# Patient Record
Sex: Male | Born: 1940 | Race: White | Hispanic: No | Marital: Married | Smoking: Former smoker
Health system: Southern US, Community
[De-identification: ages and names within clinical notes are randomized; demographics above are authoritative.]

## PROBLEM LIST (undated history)

## (undated) DIAGNOSIS — C139 Malignant neoplasm of hypopharynx, unspecified: Secondary | ICD-10-CM

## (undated) DIAGNOSIS — I4891 Unspecified atrial fibrillation: Secondary | ICD-10-CM

## (undated) DIAGNOSIS — G4730 Sleep apnea, unspecified: Secondary | ICD-10-CM

## (undated) DIAGNOSIS — R319 Hematuria, unspecified: Secondary | ICD-10-CM

## (undated) DIAGNOSIS — E039 Hypothyroidism, unspecified: Secondary | ICD-10-CM

## (undated) DIAGNOSIS — C140 Malignant neoplasm of pharynx, unspecified: Secondary | ICD-10-CM

## (undated) DIAGNOSIS — E7800 Pure hypercholesterolemia, unspecified: Secondary | ICD-10-CM

## (undated) DIAGNOSIS — B029 Zoster without complications: Secondary | ICD-10-CM

## (undated) HISTORY — PX: POLYPECTOMY: SHX149

## (undated) HISTORY — DX: Hypothyroidism, unspecified: E03.9

## (undated) HISTORY — DX: Hematuria, unspecified: R31.9

## (undated) HISTORY — DX: Malignant neoplasm of pharynx, unspecified: C14.0

## (undated) HISTORY — DX: Zoster without complications: B02.9

## (undated) HISTORY — DX: Malignant neoplasm of hypopharynx, unspecified: C13.9

## (undated) HISTORY — DX: Sleep apnea, unspecified: G47.30

## (undated) HISTORY — DX: Pure hypercholesterolemia, unspecified: E78.00

## (undated) HISTORY — PX: LAMINECTOMY: SHX219

## (undated) HISTORY — PX: INGUINAL HERNIA REPAIR: SUR1180

## (undated) HISTORY — DX: Unspecified atrial fibrillation: I48.91

---

## 1998-03-05 ENCOUNTER — Other Ambulatory Visit: Admission: RE | Admit: 1998-03-05 | Discharge: 1998-03-05 | Payer: Self-pay | Admitting: Urology

## 1999-04-25 ENCOUNTER — Emergency Department (HOSPITAL_COMMUNITY): Admission: EM | Admit: 1999-04-25 | Discharge: 1999-04-25 | Payer: Self-pay | Admitting: Emergency Medicine

## 1999-04-25 ENCOUNTER — Encounter: Payer: Self-pay | Admitting: Emergency Medicine

## 2001-09-17 ENCOUNTER — Ambulatory Visit (HOSPITAL_COMMUNITY): Admission: RE | Admit: 2001-09-17 | Discharge: 2001-09-17 | Payer: Self-pay | Admitting: Gastroenterology

## 2001-10-10 ENCOUNTER — Ambulatory Visit: Admission: RE | Admit: 2001-10-10 | Discharge: 2001-12-17 | Payer: Self-pay | Admitting: Radiation Oncology

## 2001-12-16 ENCOUNTER — Ambulatory Visit (HOSPITAL_COMMUNITY): Admission: RE | Admit: 2001-12-16 | Discharge: 2001-12-16 | Payer: Self-pay | Admitting: General Surgery

## 2002-01-23 ENCOUNTER — Ambulatory Visit: Admission: RE | Admit: 2002-01-23 | Discharge: 2002-01-23 | Payer: Self-pay | Admitting: Radiation Oncology

## 2002-07-03 ENCOUNTER — Ambulatory Visit: Admission: RE | Admit: 2002-07-03 | Discharge: 2002-07-03 | Payer: Self-pay | Admitting: Radiation Oncology

## 2002-08-07 ENCOUNTER — Ambulatory Visit: Admission: RE | Admit: 2002-08-07 | Discharge: 2002-08-07 | Payer: Self-pay | Admitting: Radiation Oncology

## 2002-11-06 ENCOUNTER — Ambulatory Visit (HOSPITAL_COMMUNITY): Admission: RE | Admit: 2002-11-06 | Discharge: 2002-11-06 | Payer: Self-pay | Admitting: Radiation Oncology

## 2002-11-06 ENCOUNTER — Ambulatory Visit: Admission: RE | Admit: 2002-11-06 | Discharge: 2002-11-06 | Payer: Self-pay | Admitting: Radiation Oncology

## 2003-03-12 ENCOUNTER — Ambulatory Visit: Admission: RE | Admit: 2003-03-12 | Discharge: 2003-03-12 | Payer: Self-pay | Admitting: Radiation Oncology

## 2003-03-25 ENCOUNTER — Ambulatory Visit: Admission: RE | Admit: 2003-03-25 | Discharge: 2003-03-25 | Payer: Self-pay | Admitting: Radiation Oncology

## 2003-07-30 ENCOUNTER — Ambulatory Visit: Admission: RE | Admit: 2003-07-30 | Discharge: 2003-07-30 | Payer: Self-pay | Admitting: Radiation Oncology

## 2003-09-26 ENCOUNTER — Emergency Department (HOSPITAL_COMMUNITY): Admission: EM | Admit: 2003-09-26 | Discharge: 2003-09-26 | Payer: Self-pay | Admitting: Emergency Medicine

## 2003-12-02 ENCOUNTER — Ambulatory Visit: Admission: RE | Admit: 2003-12-02 | Discharge: 2003-12-10 | Payer: Self-pay | Admitting: Radiation Oncology

## 2004-01-07 ENCOUNTER — Ambulatory Visit (HOSPITAL_COMMUNITY): Admission: RE | Admit: 2004-01-07 | Discharge: 2004-01-07 | Payer: Self-pay | Admitting: Internal Medicine

## 2004-06-08 ENCOUNTER — Ambulatory Visit: Admission: RE | Admit: 2004-06-08 | Discharge: 2004-06-08 | Payer: Self-pay | Admitting: Radiation Oncology

## 2004-06-10 ENCOUNTER — Ambulatory Visit (HOSPITAL_COMMUNITY): Admission: RE | Admit: 2004-06-10 | Discharge: 2004-06-10 | Payer: Self-pay | Admitting: Radiation Oncology

## 2004-06-13 ENCOUNTER — Ambulatory Visit (HOSPITAL_BASED_OUTPATIENT_CLINIC_OR_DEPARTMENT_OTHER): Admission: RE | Admit: 2004-06-13 | Discharge: 2004-06-13 | Payer: Self-pay | Admitting: General Surgery

## 2004-06-13 ENCOUNTER — Ambulatory Visit (HOSPITAL_COMMUNITY): Admission: RE | Admit: 2004-06-13 | Discharge: 2004-06-13 | Payer: Self-pay | Admitting: General Surgery

## 2004-12-07 ENCOUNTER — Ambulatory Visit: Admission: RE | Admit: 2004-12-07 | Discharge: 2004-12-19 | Payer: Self-pay | Admitting: Radiation Oncology

## 2005-04-05 ENCOUNTER — Ambulatory Visit (HOSPITAL_BASED_OUTPATIENT_CLINIC_OR_DEPARTMENT_OTHER): Admission: RE | Admit: 2005-04-05 | Discharge: 2005-04-05 | Payer: Self-pay | Admitting: Urology

## 2005-05-31 ENCOUNTER — Ambulatory Visit: Admission: RE | Admit: 2005-05-31 | Discharge: 2005-07-06 | Payer: Self-pay | Admitting: Radiation Oncology

## 2005-05-31 LAB — BUN: BUN: 16 mg/dL (ref 6–23)

## 2005-06-02 ENCOUNTER — Ambulatory Visit (HOSPITAL_COMMUNITY): Admission: RE | Admit: 2005-06-02 | Discharge: 2005-06-02 | Payer: Self-pay | Admitting: Radiation Oncology

## 2005-06-07 LAB — THYROID PROFILE - CHCC: Free Thyroxine Index: 2.3 (ref 1.0–3.9)

## 2006-01-10 ENCOUNTER — Ambulatory Visit: Admission: RE | Admit: 2006-01-10 | Discharge: 2006-03-26 | Payer: Self-pay | Admitting: Radiation Oncology

## 2010-09-21 ENCOUNTER — Other Ambulatory Visit: Payer: Self-pay | Admitting: Dermatology

## 2011-01-19 DIAGNOSIS — L723 Sebaceous cyst: Secondary | ICD-10-CM | POA: Diagnosis not present

## 2011-01-19 DIAGNOSIS — M25519 Pain in unspecified shoulder: Secondary | ICD-10-CM | POA: Diagnosis not present

## 2011-01-19 DIAGNOSIS — Z85828 Personal history of other malignant neoplasm of skin: Secondary | ICD-10-CM | POA: Diagnosis not present

## 2011-01-19 DIAGNOSIS — E7800 Pure hypercholesterolemia, unspecified: Secondary | ICD-10-CM | POA: Diagnosis not present

## 2011-01-19 DIAGNOSIS — E039 Hypothyroidism, unspecified: Secondary | ICD-10-CM | POA: Diagnosis not present

## 2011-01-19 DIAGNOSIS — Z79899 Other long term (current) drug therapy: Secondary | ICD-10-CM | POA: Diagnosis not present

## 2011-01-25 DIAGNOSIS — M719 Bursopathy, unspecified: Secondary | ICD-10-CM | POA: Diagnosis not present

## 2011-01-25 DIAGNOSIS — M67919 Unspecified disorder of synovium and tendon, unspecified shoulder: Secondary | ICD-10-CM | POA: Diagnosis not present

## 2011-01-25 DIAGNOSIS — M25519 Pain in unspecified shoulder: Secondary | ICD-10-CM | POA: Diagnosis not present

## 2011-03-08 DIAGNOSIS — M25519 Pain in unspecified shoulder: Secondary | ICD-10-CM | POA: Diagnosis not present

## 2011-09-18 DIAGNOSIS — M2040 Other hammer toe(s) (acquired), unspecified foot: Secondary | ICD-10-CM | POA: Diagnosis not present

## 2011-10-17 DIAGNOSIS — D3130 Benign neoplasm of unspecified choroid: Secondary | ICD-10-CM | POA: Diagnosis not present

## 2011-10-17 DIAGNOSIS — H5200 Hypermetropia, unspecified eye: Secondary | ICD-10-CM | POA: Diagnosis not present

## 2012-01-19 DIAGNOSIS — Z125 Encounter for screening for malignant neoplasm of prostate: Secondary | ICD-10-CM | POA: Diagnosis not present

## 2012-01-19 DIAGNOSIS — E7800 Pure hypercholesterolemia, unspecified: Secondary | ICD-10-CM | POA: Diagnosis not present

## 2012-01-19 DIAGNOSIS — E039 Hypothyroidism, unspecified: Secondary | ICD-10-CM | POA: Diagnosis not present

## 2012-01-19 DIAGNOSIS — M6530 Trigger finger, unspecified finger: Secondary | ICD-10-CM | POA: Diagnosis not present

## 2012-01-19 DIAGNOSIS — R7301 Impaired fasting glucose: Secondary | ICD-10-CM | POA: Diagnosis not present

## 2012-01-19 DIAGNOSIS — Z1331 Encounter for screening for depression: Secondary | ICD-10-CM | POA: Diagnosis not present

## 2012-02-07 DIAGNOSIS — M6530 Trigger finger, unspecified finger: Secondary | ICD-10-CM | POA: Diagnosis not present

## 2012-10-17 DIAGNOSIS — D3130 Benign neoplasm of unspecified choroid: Secondary | ICD-10-CM | POA: Diagnosis not present

## 2012-10-17 DIAGNOSIS — H2589 Other age-related cataract: Secondary | ICD-10-CM | POA: Diagnosis not present

## 2012-10-17 DIAGNOSIS — H524 Presbyopia: Secondary | ICD-10-CM | POA: Diagnosis not present

## 2013-02-03 DIAGNOSIS — L723 Sebaceous cyst: Secondary | ICD-10-CM | POA: Diagnosis not present

## 2013-02-03 DIAGNOSIS — L570 Actinic keratosis: Secondary | ICD-10-CM | POA: Diagnosis not present

## 2013-02-04 DIAGNOSIS — R7301 Impaired fasting glucose: Secondary | ICD-10-CM | POA: Diagnosis not present

## 2013-02-04 DIAGNOSIS — R9431 Abnormal electrocardiogram [ECG] [EKG]: Secondary | ICD-10-CM | POA: Diagnosis not present

## 2013-02-04 DIAGNOSIS — Z125 Encounter for screening for malignant neoplasm of prostate: Secondary | ICD-10-CM | POA: Diagnosis not present

## 2013-02-04 DIAGNOSIS — Z23 Encounter for immunization: Secondary | ICD-10-CM | POA: Diagnosis not present

## 2013-02-04 DIAGNOSIS — Z1331 Encounter for screening for depression: Secondary | ICD-10-CM | POA: Diagnosis not present

## 2013-02-04 DIAGNOSIS — I4891 Unspecified atrial fibrillation: Secondary | ICD-10-CM | POA: Diagnosis not present

## 2013-02-04 DIAGNOSIS — E7800 Pure hypercholesterolemia, unspecified: Secondary | ICD-10-CM | POA: Diagnosis not present

## 2013-02-04 DIAGNOSIS — E039 Hypothyroidism, unspecified: Secondary | ICD-10-CM | POA: Diagnosis not present

## 2013-02-04 DIAGNOSIS — Z79899 Other long term (current) drug therapy: Secondary | ICD-10-CM | POA: Diagnosis not present

## 2013-02-19 ENCOUNTER — Encounter: Payer: Self-pay | Admitting: Cardiology

## 2013-02-20 ENCOUNTER — Telehealth: Payer: Self-pay | Admitting: Cardiology

## 2013-02-20 ENCOUNTER — Encounter: Payer: Self-pay | Admitting: Cardiology

## 2013-02-20 ENCOUNTER — Ambulatory Visit (HOSPITAL_COMMUNITY): Payer: Medicare Other | Attending: Cardiology | Admitting: Radiology

## 2013-02-20 ENCOUNTER — Ambulatory Visit (INDEPENDENT_AMBULATORY_CARE_PROVIDER_SITE_OTHER): Payer: Medicare Other | Admitting: Cardiology

## 2013-02-20 VITALS — BP 128/78 | HR 72 | Ht 72.0 in | Wt 185.0 lb

## 2013-02-20 DIAGNOSIS — I4891 Unspecified atrial fibrillation: Secondary | ICD-10-CM

## 2013-02-20 DIAGNOSIS — I359 Nonrheumatic aortic valve disorder, unspecified: Secondary | ICD-10-CM | POA: Insufficient documentation

## 2013-02-20 DIAGNOSIS — E785 Hyperlipidemia, unspecified: Secondary | ICD-10-CM | POA: Insufficient documentation

## 2013-02-20 DIAGNOSIS — Z7901 Long term (current) use of anticoagulants: Secondary | ICD-10-CM | POA: Diagnosis not present

## 2013-02-20 DIAGNOSIS — E7800 Pure hypercholesterolemia, unspecified: Secondary | ICD-10-CM | POA: Diagnosis not present

## 2013-02-20 DIAGNOSIS — I4821 Permanent atrial fibrillation: Secondary | ICD-10-CM | POA: Insufficient documentation

## 2013-02-20 LAB — CBC WITH DIFFERENTIAL/PLATELET
BASOS PCT: 0.4 % (ref 0.0–3.0)
Basophils Absolute: 0 10*3/uL (ref 0.0–0.1)
EOS ABS: 0.1 10*3/uL (ref 0.0–0.7)
Eosinophils Relative: 1.4 % (ref 0.0–5.0)
HEMATOCRIT: 40.4 % (ref 39.0–52.0)
HEMOGLOBIN: 13.1 g/dL (ref 13.0–17.0)
LYMPHS PCT: 30.4 % (ref 12.0–46.0)
Lymphs Abs: 2.3 10*3/uL (ref 0.7–4.0)
MCHC: 32.5 g/dL (ref 30.0–36.0)
MCV: 96.9 fl (ref 78.0–100.0)
MONOS PCT: 8.9 % (ref 3.0–12.0)
Monocytes Absolute: 0.7 10*3/uL (ref 0.1–1.0)
NEUTROS ABS: 4.5 10*3/uL (ref 1.4–7.7)
NEUTROS PCT: 58.9 % (ref 43.0–77.0)
Platelets: 338 10*3/uL (ref 150.0–400.0)
RBC: 4.17 Mil/uL — AB (ref 4.22–5.81)
RDW: 13.8 % (ref 11.5–14.6)
WBC: 7.6 10*3/uL (ref 4.5–10.5)

## 2013-02-20 LAB — BASIC METABOLIC PANEL
BUN: 22 mg/dL (ref 6–23)
CALCIUM: 8.9 mg/dL (ref 8.4–10.5)
CHLORIDE: 106 meq/L (ref 96–112)
CO2: 27 meq/L (ref 19–32)
CREATININE: 1 mg/dL (ref 0.4–1.5)
GFR: 79.39 mL/min (ref >60.00)
Glucose, Bld: 97 mg/dL (ref 70–99)
Potassium: 4.7 mEq/L (ref 3.5–5.1)
Sodium: 139 mEq/L (ref 135–145)

## 2013-02-20 MED ORDER — RIVAROXABAN 20 MG PO TABS: 20.0000 mg | ORAL_TABLET | Freq: Every day | ORAL | Status: DC

## 2013-02-20 MED ORDER — APIXABAN 5 MG PO TABS: 5.0000 mg | ORAL_TABLET | Freq: Two times a day (BID) | ORAL | Status: DC

## 2013-02-20 NOTE — Patient Instructions (Addendum)
Your physician has requested that you have an echocardiogram. Echocardiography is a painless test that uses sound waves to create images of your heart. It provides your doctor with information about the size and shape of your heart and how well your heart's chambers and valves are working. This procedure takes approximately one hour. There are no restrictions for this procedure.  Your physician recommends that you have lab work today: CBC, BMET  Your physician has recommended you make the following change in your medication:   1. Start XARELTO 20 mg daily 2. STOP ASPRIN  Your physician recommends that you schedule a follow-up appointment in: 1 month with DR. Honeywell

## 2013-02-20 NOTE — Progress Notes (Signed)
William Chung. [address], [address] [place], [place]  [address] Phone: [phone] Fax:  [phone]  Date:  02/20/2013   ID:  William Chung, DOB [birth year], MRN [id number]  PCP:  No primary provider on file.   History of Present Illness: William Chung is a 73 y.o. male here for the evaluation of atrial fibrillation. He was diagnosed with asymptomatic, rate controlled atrial fibrillation and given aspirin by Dr. Marisue Humble on 02/04/13 to heart rate was 70. TSH 3.2, LDL 88, hemoglobin A1c 6.1. Creatinine 0.9, potassium 4.4.  EKG, 02/04/13, atrial fibrillation with heart rate of 81 beats per minute. Poor R wave progression.  No prior stroke history, no diabetes. No diagnosis of hypertension. No prior diagnosis of vascular disease or heart failure.  Doing well, no problems playing golf. Plays at [employer] [facility] [employer]. Carry own bag. Has a house also at [facility]. He is a friend whose wife has atrial fibrillation and was terribly symptomatic with it.    Wt Readings from Last 3 Encounters:  02/20/13 185 lb (83.915 kg)     Past Medical History  Diagnosis Date  . Squamous cell cancer of hypopharynx     , vocal cords  . Hypothyroid   . Hypercholesterolemia   . Hematuria   . Shingles     Past Surgical History  Procedure Laterality Date  . Polypectomy      , Vocal Cords  . Laminectomy    . Inguinal hernia repair Right    Had radiation to larynx. On thyroid meds.   Current Outpatient Prescriptions  Medication Sig Dispense Refill  . aspirin 325 MG EC tablet Take 325 mg by mouth daily.      [name] Kitchen levothyroxine (SYNTHROID, LEVOTHROID) 50 MCG tablet Take 50 mcg by mouth daily before breakfast.      . simvastatin (ZOCOR) 20 MG tablet Take 20 mg by mouth daily.       No current facility-administered medications for this visit.    Allergies:   No Known Allergies  Social History:  The patient  reports that he has quit smoking. He does not have any smokeless tobacco history on file. He  reports that he does not use illicit drugs. Quit 20 years ago tobacco. 2-3 Etoh a day, coctails.   Family History  Problem Relation Age of Onset  . Congestive Heart Failure Mother   . Dementia Mother   . Pneumonia Father   Father died in 51's. No early CAD.   ROS:  Please see the history of present illness.   Denies any fevers, chills, strokelike symptoms,   All other systems reviewed and negative.   PHYSICAL EXAM: VS:  BP 128/78  Pulse 72  Ht 6' (1.829 m)  Wt 185 lb (83.915 kg)  BMI 25.08 kg/m2 Well nourished, well developed, in no acute distress HEENT: normal, [place]/AT, EOMI Neck: no JVD, normal carotid upstroke, no bruit Cardiac:  normal S1, S2; Irreg irreg normal rate; no murmur Lungs:  clear to auscultation bilaterally, no wheezing, rhonchi or rales Abd: soft, nontender, no hepatomegaly, no bruits Ext: no edema, 2+ distal pulses Skin: warm and dry GU: deferred Neuro: no focal abnormalities noted, AAO x 3  EKG:  Afib as above     ASSESSMENT AND PLAN:  1. Atrial fibrillation- newly diagnosed. Asymptomatic. Good rate control on no medications. We discussed the risks and benefits of anticoagulation and because of his age approaching , CHADS-VASc would be 2 warranting the use of anticoagulation.  I explained the risks of bleeding at length. I explained the physiologic process of atrial fibrillation. I explained rhythm versus rate control, no change in mortality. Over the next month, he will assess his symptoms. [place] and if he notices increasing symptoms, shortness of breath for instance, we may wish to proceed with elective cardioversion to see if this helps. If not, we will continue with rate control strategy. I will start Xeralto 20mg  PO QD. I will also check a CBC and a basic metabolic profile. We will repeat this in 6 months. I will see him back in one month followup. I will also check an echocardiogram to ensure proper structure and function of his heart. TSH is  normal. He is on low-dose Synthroid supplementation. He does drink approximately 2-3 cocktails a day. We did discuss the possibility/correlation with atrial fibrillation. He also enjoys green leafy vegetables. Because of this, we will try to use novel anticoagulation. We discussed the possibilities of increased cost for medication.  2.  hypercholesterolemia-continue with low-dose simvastatin. Spent approximately 40 minutes in discussion.  Signed, Candee Furbish, MD [facility]  02/20/2013 10:18 AM

## 2013-02-20 NOTE — Telephone Encounter (Signed)
Pt calls b/c the new prescription of Xarelto was going to cost him $354.00/month. Pt does not have drug plan coverage with his insurance or Medicare. Wanted to know what other substitute he could use. He will also call his insurance & see if he can add a drug plan to this.  Dr. Marlou Porch has reviewed. Recommended Eliquis 5 mg bid. We do have a copay card I can send the patient for this. Pt agrees.  Prescription for Eliquis sent in to [facility] Mailed co pay card to pt Horton Chin RN

## 2013-02-20 NOTE — Telephone Encounter (Signed)
New message  Patient was given a script for [facility], he went to pick it up at [facility] and it is $ 354.00. Patient dont have insurance and wants to know if there is something cheaper he can take. His PCP also did a echo and blood work today, Please call and advise.

## 2013-02-20 NOTE — Telephone Encounter (Signed)
This was a duplicate call Horton Chin RN

## 2013-02-20 NOTE — Progress Notes (Signed)
Quick Note:  Preliminary report reviewed by triage nurse and sent to MD desk. ______ 

## 2013-02-20 NOTE — Telephone Encounter (Signed)
New message  Patient called, he went to get his medication Xerolol & it was $354.00 with no insurance he can not afford that. He would like to know if there is something different that he can take.  Please call and advise.

## 2013-02-20 NOTE — Progress Notes (Signed)
Echocardiogram performed.  

## 2013-02-26 ENCOUNTER — Telehealth: Payer: Self-pay

## 2013-02-26 NOTE — Telephone Encounter (Signed)
Discussed with him on. He will be talking with his insurance agent on Monday. He does not have a supplemental plan. Hopefully we can figure out a way for him to take Eliquis in a way that is not $300 a month.

## 2013-02-26 NOTE — Telephone Encounter (Signed)
Patient called wanting to talk to Dr Marlou Porch about the price of xarelto and eliquis. Call him at [phone]

## 2013-03-04 ENCOUNTER — Telehealth: Payer: Self-pay | Admitting: Cardiology

## 2013-03-04 NOTE — Telephone Encounter (Signed)
New message     Talk to a nurse regarding blood thinner---Xarelto and eliquis is too expensive.  Also, medication was called in to [facility].

## 2013-03-06 MED ORDER — RIVAROXABAN 20 MG PO TABS: 20.0000 mg | ORAL_TABLET | Freq: Every day | ORAL | Status: DC

## 2013-03-06 MED ORDER — LISINOPRIL 5 MG PO TABS: 5.0000 mg | ORAL_TABLET | Freq: Every day | ORAL | Status: DC

## 2013-03-06 NOTE — Telephone Encounter (Signed)
Spoke with patient, patient will start Xarelto for a 30 days on free trial , Will submit a application for aproval for Eliquis for one year free. If approved patient will Start Eliquis if not patient will continue Xarelto.Explainded to patient there is no side effect to transition from Xarelto to Eliquis. Pateitn showed a little concern about start Lisinopril 5mg , see Echo result note.

## 2013-03-19 ENCOUNTER — Telehealth: Payer: Self-pay | Admitting: Cardiology

## 2013-03-19 NOTE — Telephone Encounter (Signed)
New message     Pt said the nurse is going to have Dr Marlou Porch complete forms for him.  Are the forms ready?

## 2013-03-26 NOTE — Telephone Encounter (Signed)
Paperwork is done patient has picked it up.[name] KitchenMarland Kitchen

## 2013-04-03 ENCOUNTER — Encounter: Payer: Self-pay | Admitting: Cardiology

## 2013-04-03 ENCOUNTER — Ambulatory Visit (INDEPENDENT_AMBULATORY_CARE_PROVIDER_SITE_OTHER): Payer: Medicare Other | Admitting: Cardiology

## 2013-04-03 VITALS — BP 126/80 | HR 74 | Ht 72.0 in | Wt 183.0 lb

## 2013-04-03 DIAGNOSIS — I4891 Unspecified atrial fibrillation: Secondary | ICD-10-CM

## 2013-04-03 DIAGNOSIS — Z79899 Other long term (current) drug therapy: Secondary | ICD-10-CM | POA: Diagnosis not present

## 2013-04-03 DIAGNOSIS — I517 Cardiomegaly: Secondary | ICD-10-CM | POA: Insufficient documentation

## 2013-04-03 DIAGNOSIS — E7800 Pure hypercholesterolemia, unspecified: Secondary | ICD-10-CM | POA: Diagnosis not present

## 2013-04-03 NOTE — Patient Instructions (Signed)
Your physician recommends that you continue on your current medications as directed. Please refer to the Current Medication list given to you today.  Your physician wants you to follow-up in: 6 months with Dr. Skains. You will receive a reminder letter in the mail two months in advance. If you don't receive a letter, please call our office to schedule the follow-up appointment.  

## 2013-04-03 NOTE — Progress Notes (Signed)
[place]. [address], [place]  [address] Phone: [phone] Fax:  [phone]  Date:  04/03/2013   ID:  William Chung, DOB 1940-11-12, MRN [id number]  PCP:  No primary provider on file.   History of Present Illness: William Chung is a 73 y.o. male here for the follow up of atrial fibrillation. He was diagnosed with asymptomatic, rate controlled atrial fibrillation and given aspirin by Dr. Marisue Humble on 02/04/13 to heart rate was 70. TSH 3.2, LDL 88, hemoglobin A1c 6.1. Creatinine 0.9, potassium 4.4.  EKG, 02/04/13, atrial fibrillation with heart rate of 81 beats per minute. Poor R wave progression.  No prior stroke history, no diabetes. No diagnosis of hypertension. No prior diagnosis of vascular disease or heart failure.  Doing well, no problems playing golf. Plays at [employer]. Carry own bag. Has a house also at [facility]. He is a friend whose wife has atrial fibrillation and was terribly symptomatic with it.   04/03/13-doing well. He's gained a few pounds. Not walking as much when playing golf. Cost is an issue for medication, does not have Medicare part D.   Wt Readings from Last 3 Encounters:  04/03/13 183 lb (83.008 kg)  02/20/13 185 lb (83.915 kg)     Past Medical History  Diagnosis Date  . Squamous cell cancer of hypopharynx     , vocal cords  . Hypothyroid   . Hypercholesterolemia   . Hematuria   . Shingles   . Atrial fibrillation     Past Surgical History  Procedure Laterality Date  . Polypectomy      , Vocal Cords  . Laminectomy    . Inguinal hernia repair Right    Had radiation to larynx. On thyroid meds.   Current Outpatient Prescriptions  Medication Sig Dispense Refill  . levothyroxine (SYNTHROID, LEVOTHROID) 50 MCG tablet Take 50 mcg by mouth daily before breakfast.      . lisinopril (PRINIVIL,ZESTRIL) 5 MG tablet Take 1 tablet (5 mg total) by mouth daily.  30 tablet  3  . Rivaroxaban (XARELTO) 20 MG TABS tablet Take 1  tablet (20 mg total) by mouth daily with supper.  30 tablet  1  . simvastatin (ZOCOR) 20 MG tablet Take 20 mg by mouth daily.       No current facility-administered medications for this visit.    Allergies:   No Known Allergies  Social History:  The patient  reports that he has quit smoking. He does not have any smokeless tobacco history on file. He reports that he does not use illicit drugs. Quit 20 years ago tobacco. 2-3 Etoh a day, coctails.   Family History  Problem Relation Age of Onset  . Congestive Heart Failure Mother   . Dementia Mother   . Pneumonia Father   Father died in 35's. No early CAD.   ROS:  Please see the history of present illness.   Denies any fevers, chills, strokelike symptoms,   All other systems reviewed and negative.   PHYSICAL EXAM: VS:  BP 126/80  Pulse 74  Ht 6' (1.829 m)  Wt 183 lb (83.008 kg)  BMI 24.81 kg/m2 Well nourished, well developed, in no acute distress HEENT: normal, [place]/AT, EOMI Neck: no JVD, normal carotid upstroke, no bruit Cardiac:  normal S1, S2; Irreg irreg normal rate; no murmur Lungs:  clear to auscultation bilaterally, no wheezing, rhonchi or rales Abd: soft, nontender, no hepatomegaly, no bruits Ext: no  edema, 2+ distal pulses Skin: warm and dry GU: deferred Neuro: no focal abnormalities noted, AAO x 3  EKG:  Afib as above     ASSESSMENT AND PLAN:  1. Atrial fibrillation- newly diagnosed. Asymptomatic. Good rate control on no medications. We discussed the risks and benefits of anticoagulation previously  and because of his age approaching , CHADS-VASc (2) warranting the use of anticoagulation. I explained the risks of bleeding at length. I explained the physiologic process of atrial fibrillation. I explained rhythm versus rate control, no change in mortality. Xarelto 20mg  PO QD.ECHO with dilated left atrium, low normal EF. Lisinopril 5 mg started. This is for remodeling. TSH is normal. He is on low-dose Synthroid  supplementation. He does drink approximately 2-3 cocktails a day. We did discuss the possibility/correlation with atrial fibrillation. He also enjoys green leafy vegetables. Because of this, we will try to use novel anticoagulation. We discussed the possibilities of increased cost for medication.  2. Hypercholesterolemia-continue with low-dose simvastatin.  3. Dilated left atrium-lisinopril 5 mg for remodeling. 4. Form was given for me to fill out for patient assistance.  Signed, Candee Furbish, MD [facility]  04/03/2013 8:44 AM

## 2013-04-14 ENCOUNTER — Telehealth: Payer: Self-pay | Admitting: Cardiology

## 2013-04-14 NOTE — Telephone Encounter (Signed)
New message ° ° ° ° ° °Returning a nurses call °

## 2013-04-16 NOTE — Telephone Encounter (Signed)
Follow up     Patient is returning [name]'s call again.  He did not call him back from Monday.

## 2013-04-16 NOTE — Telephone Encounter (Signed)
Spoke with pt about follow up call from [employer] on 4/13. Pt states he is following up with him. Pt unsure for what reason. Told Pt [name] off today. Pt verbalized that completely fine, to have [name] reply back when back in the office tomorrow 4/16.

## 2013-04-18 NOTE — Telephone Encounter (Signed)
Left message on patients voicemail explaining why I had called. Advised issue resolved

## 2013-04-24 ENCOUNTER — Telehealth: Payer: Self-pay | Admitting: *Deleted

## 2013-04-24 NOTE — Telephone Encounter (Signed)
[employer] APPROVED eliquis free of charge 04/21/2013-01/01/2014

## 2013-05-06 ENCOUNTER — Telehealth: Payer: Self-pay | Admitting: Cardiology

## 2013-05-06 NOTE — Telephone Encounter (Signed)
°  Patient has questions regarding medications. He would like to talk with Dr. Marlou Porch. He is currently taking Xarerlo. He would like to speak with Dr Marlou Porch about switching to Eliquist. Please call and advise.

## 2013-05-08 NOTE — Telephone Encounter (Signed)
Will forward to Dr. Marlou Porch for review, Patient would like to know if he could go back on Eliquis 5mg  once daily ,being he was approved for the patient assistance.

## 2013-05-09 NOTE — Telephone Encounter (Signed)
Yes. Okay to switch to Eliquis 5 mg twice a day

## 2013-05-15 NOTE — Telephone Encounter (Signed)
LVM advised that its ok to start Eliquis 5 mg twice daily.

## 2013-06-30 ENCOUNTER — Other Ambulatory Visit: Payer: Self-pay | Admitting: Cardiology

## 2013-07-28 ENCOUNTER — Telehealth: Payer: Self-pay | Admitting: Cardiology

## 2013-07-28 NOTE — Telephone Encounter (Signed)
Sent to MR. 

## 2013-07-28 NOTE — Telephone Encounter (Signed)
New message           Pt would like his records sent to his current cardiologist / he is no longer living in [place]

## 2013-07-31 ENCOUNTER — Other Ambulatory Visit: Payer: Self-pay | Admitting: Cardiology

## 2013-07-31 ENCOUNTER — Telehealth: Payer: Self-pay | Admitting: Cardiology

## 2013-07-31 NOTE — Telephone Encounter (Signed)
Release Emailed To Pt  7.30.15/km

## 2013-08-27 DIAGNOSIS — I4891 Unspecified atrial fibrillation: Secondary | ICD-10-CM | POA: Diagnosis not present

## 2013-08-27 DIAGNOSIS — E785 Hyperlipidemia, unspecified: Secondary | ICD-10-CM | POA: Diagnosis not present

## 2013-09-16 DIAGNOSIS — R490 Dysphonia: Secondary | ICD-10-CM | POA: Diagnosis not present

## 2013-09-16 DIAGNOSIS — E039 Hypothyroidism, unspecified: Secondary | ICD-10-CM | POA: Diagnosis not present

## 2014-02-04 DIAGNOSIS — E780 Pure hypercholesterolemia: Secondary | ICD-10-CM | POA: Diagnosis not present

## 2014-02-04 DIAGNOSIS — L814 Other melanin hyperpigmentation: Secondary | ICD-10-CM | POA: Diagnosis not present

## 2014-02-04 DIAGNOSIS — Z08 Encounter for follow-up examination after completed treatment for malignant neoplasm: Secondary | ICD-10-CM | POA: Diagnosis not present

## 2014-02-04 DIAGNOSIS — L570 Actinic keratosis: Secondary | ICD-10-CM | POA: Diagnosis not present

## 2014-02-04 DIAGNOSIS — Z125 Encounter for screening for malignant neoplasm of prostate: Secondary | ICD-10-CM | POA: Diagnosis not present

## 2014-02-04 DIAGNOSIS — I4891 Unspecified atrial fibrillation: Secondary | ICD-10-CM | POA: Diagnosis not present

## 2014-02-04 DIAGNOSIS — R7301 Impaired fasting glucose: Secondary | ICD-10-CM | POA: Diagnosis not present

## 2014-02-04 DIAGNOSIS — Z85828 Personal history of other malignant neoplasm of skin: Secondary | ICD-10-CM | POA: Diagnosis not present

## 2014-02-04 DIAGNOSIS — Z23 Encounter for immunization: Secondary | ICD-10-CM | POA: Diagnosis not present

## 2014-02-04 DIAGNOSIS — C329 Malignant neoplasm of larynx, unspecified: Secondary | ICD-10-CM | POA: Diagnosis not present

## 2014-02-04 DIAGNOSIS — E039 Hypothyroidism, unspecified: Secondary | ICD-10-CM | POA: Diagnosis not present

## 2014-02-04 DIAGNOSIS — D225 Melanocytic nevi of trunk: Secondary | ICD-10-CM | POA: Diagnosis not present

## 2014-02-09 DIAGNOSIS — D3131 Benign neoplasm of right choroid: Secondary | ICD-10-CM | POA: Diagnosis not present

## 2014-02-09 DIAGNOSIS — H524 Presbyopia: Secondary | ICD-10-CM | POA: Diagnosis not present

## 2014-02-09 DIAGNOSIS — H2513 Age-related nuclear cataract, bilateral: Secondary | ICD-10-CM | POA: Diagnosis not present

## 2014-02-26 DIAGNOSIS — I482 Chronic atrial fibrillation: Secondary | ICD-10-CM | POA: Diagnosis not present

## 2014-02-26 DIAGNOSIS — E782 Mixed hyperlipidemia: Secondary | ICD-10-CM | POA: Diagnosis not present

## 2014-03-18 DIAGNOSIS — L570 Actinic keratosis: Secondary | ICD-10-CM | POA: Diagnosis not present

## 2014-04-24 DIAGNOSIS — H2513 Age-related nuclear cataract, bilateral: Secondary | ICD-10-CM | POA: Diagnosis not present

## 2014-05-22 DIAGNOSIS — H11033 Double pterygium of eye, bilateral: Secondary | ICD-10-CM | POA: Diagnosis not present

## 2014-05-22 DIAGNOSIS — H2510 Age-related nuclear cataract, unspecified eye: Secondary | ICD-10-CM | POA: Diagnosis not present

## 2014-06-18 DIAGNOSIS — Z7901 Long term (current) use of anticoagulants: Secondary | ICD-10-CM | POA: Diagnosis not present

## 2014-06-18 DIAGNOSIS — Z79899 Other long term (current) drug therapy: Secondary | ICD-10-CM | POA: Diagnosis not present

## 2014-06-18 DIAGNOSIS — H2512 Age-related nuclear cataract, left eye: Secondary | ICD-10-CM | POA: Diagnosis not present

## 2014-06-18 DIAGNOSIS — E039 Hypothyroidism, unspecified: Secondary | ICD-10-CM | POA: Diagnosis not present

## 2014-06-18 DIAGNOSIS — H269 Unspecified cataract: Secondary | ICD-10-CM | POA: Diagnosis not present

## 2014-06-18 DIAGNOSIS — I4891 Unspecified atrial fibrillation: Secondary | ICD-10-CM | POA: Diagnosis not present

## 2014-06-18 DIAGNOSIS — Z83518 Family history of other specified eye disorder: Secondary | ICD-10-CM | POA: Diagnosis not present

## 2014-06-18 DIAGNOSIS — E785 Hyperlipidemia, unspecified: Secondary | ICD-10-CM | POA: Diagnosis not present

## 2014-08-27 DIAGNOSIS — C4491 Basal cell carcinoma of skin, unspecified: Secondary | ICD-10-CM | POA: Diagnosis not present

## 2014-08-27 DIAGNOSIS — E782 Mixed hyperlipidemia: Secondary | ICD-10-CM | POA: Diagnosis not present

## 2014-08-27 DIAGNOSIS — I482 Chronic atrial fibrillation: Secondary | ICD-10-CM | POA: Diagnosis not present

## 2014-11-12 DIAGNOSIS — C329 Malignant neoplasm of larynx, unspecified: Secondary | ICD-10-CM | POA: Diagnosis not present

## 2014-11-12 DIAGNOSIS — R21 Rash and other nonspecific skin eruption: Secondary | ICD-10-CM | POA: Diagnosis not present

## 2014-11-12 DIAGNOSIS — Z87891 Personal history of nicotine dependence: Secondary | ICD-10-CM | POA: Diagnosis not present

## 2014-11-12 DIAGNOSIS — Z7901 Long term (current) use of anticoagulants: Secondary | ICD-10-CM | POA: Diagnosis not present

## 2014-12-17 DIAGNOSIS — L821 Other seborrheic keratosis: Secondary | ICD-10-CM | POA: Diagnosis not present

## 2014-12-17 DIAGNOSIS — L728 Other follicular cysts of the skin and subcutaneous tissue: Secondary | ICD-10-CM | POA: Diagnosis not present

## 2014-12-17 DIAGNOSIS — L570 Actinic keratosis: Secondary | ICD-10-CM | POA: Diagnosis not present

## 2015-02-05 DIAGNOSIS — R7301 Impaired fasting glucose: Secondary | ICD-10-CM | POA: Diagnosis not present

## 2015-02-05 DIAGNOSIS — Z125 Encounter for screening for malignant neoplasm of prostate: Secondary | ICD-10-CM | POA: Diagnosis not present

## 2015-02-05 DIAGNOSIS — E039 Hypothyroidism, unspecified: Secondary | ICD-10-CM | POA: Diagnosis not present

## 2015-02-05 DIAGNOSIS — I4891 Unspecified atrial fibrillation: Secondary | ICD-10-CM | POA: Diagnosis not present

## 2015-02-05 DIAGNOSIS — C329 Malignant neoplasm of larynx, unspecified: Secondary | ICD-10-CM | POA: Diagnosis not present

## 2015-02-05 DIAGNOSIS — M1990 Unspecified osteoarthritis, unspecified site: Secondary | ICD-10-CM | POA: Diagnosis not present

## 2015-02-05 DIAGNOSIS — E7800 Pure hypercholesterolemia, unspecified: Secondary | ICD-10-CM | POA: Diagnosis not present

## 2015-03-01 DIAGNOSIS — I482 Chronic atrial fibrillation: Secondary | ICD-10-CM | POA: Diagnosis not present

## 2015-03-01 DIAGNOSIS — E782 Mixed hyperlipidemia: Secondary | ICD-10-CM | POA: Diagnosis not present

## 2015-03-02 DIAGNOSIS — D3131 Benign neoplasm of right choroid: Secondary | ICD-10-CM | POA: Diagnosis not present

## 2015-03-02 DIAGNOSIS — H524 Presbyopia: Secondary | ICD-10-CM | POA: Diagnosis not present

## 2015-03-02 DIAGNOSIS — D3132 Benign neoplasm of left choroid: Secondary | ICD-10-CM | POA: Diagnosis not present

## 2015-05-04 DIAGNOSIS — M65331 Trigger finger, right middle finger: Secondary | ICD-10-CM | POA: Diagnosis not present

## 2015-08-30 DIAGNOSIS — I482 Chronic atrial fibrillation: Secondary | ICD-10-CM | POA: Diagnosis not present

## 2015-12-20 DIAGNOSIS — L570 Actinic keratosis: Secondary | ICD-10-CM | POA: Diagnosis not present

## 2015-12-20 DIAGNOSIS — D225 Melanocytic nevi of trunk: Secondary | ICD-10-CM | POA: Diagnosis not present

## 2015-12-20 DIAGNOSIS — L814 Other melanin hyperpigmentation: Secondary | ICD-10-CM | POA: Diagnosis not present

## 2015-12-20 DIAGNOSIS — L821 Other seborrheic keratosis: Secondary | ICD-10-CM | POA: Diagnosis not present

## 2015-12-20 DIAGNOSIS — C44319 Basal cell carcinoma of skin of other parts of face: Secondary | ICD-10-CM | POA: Diagnosis not present

## 2015-12-20 DIAGNOSIS — D1801 Hemangioma of skin and subcutaneous tissue: Secondary | ICD-10-CM | POA: Diagnosis not present

## 2016-02-08 DIAGNOSIS — R7301 Impaired fasting glucose: Secondary | ICD-10-CM | POA: Diagnosis not present

## 2016-02-08 DIAGNOSIS — Z1389 Encounter for screening for other disorder: Secondary | ICD-10-CM | POA: Diagnosis not present

## 2016-02-08 DIAGNOSIS — C329 Malignant neoplasm of larynx, unspecified: Secondary | ICD-10-CM | POA: Diagnosis not present

## 2016-02-08 DIAGNOSIS — E7800 Pure hypercholesterolemia, unspecified: Secondary | ICD-10-CM | POA: Diagnosis not present

## 2016-02-08 DIAGNOSIS — Z23 Encounter for immunization: Secondary | ICD-10-CM | POA: Diagnosis not present

## 2016-02-08 DIAGNOSIS — E039 Hypothyroidism, unspecified: Secondary | ICD-10-CM | POA: Diagnosis not present

## 2016-02-08 DIAGNOSIS — M1990 Unspecified osteoarthritis, unspecified site: Secondary | ICD-10-CM | POA: Diagnosis not present

## 2016-02-08 DIAGNOSIS — I4891 Unspecified atrial fibrillation: Secondary | ICD-10-CM | POA: Diagnosis not present

## 2016-03-07 DIAGNOSIS — D3132 Benign neoplasm of left choroid: Secondary | ICD-10-CM | POA: Diagnosis not present

## 2016-03-07 DIAGNOSIS — H524 Presbyopia: Secondary | ICD-10-CM | POA: Diagnosis not present

## 2016-03-07 DIAGNOSIS — D3131 Benign neoplasm of right choroid: Secondary | ICD-10-CM | POA: Diagnosis not present

## 2016-03-30 DIAGNOSIS — C44319 Basal cell carcinoma of skin of other parts of face: Secondary | ICD-10-CM | POA: Diagnosis not present

## 2016-04-21 DIAGNOSIS — Z961 Presence of intraocular lens: Secondary | ICD-10-CM | POA: Diagnosis not present

## 2016-04-21 DIAGNOSIS — H26492 Other secondary cataract, left eye: Secondary | ICD-10-CM | POA: Diagnosis not present

## 2016-04-21 DIAGNOSIS — H2511 Age-related nuclear cataract, right eye: Secondary | ICD-10-CM | POA: Diagnosis not present

## 2016-04-21 DIAGNOSIS — H25011 Cortical age-related cataract, right eye: Secondary | ICD-10-CM | POA: Diagnosis not present

## 2016-07-20 DIAGNOSIS — H2511 Age-related nuclear cataract, right eye: Secondary | ICD-10-CM | POA: Diagnosis not present

## 2016-07-20 DIAGNOSIS — Z79899 Other long term (current) drug therapy: Secondary | ICD-10-CM | POA: Diagnosis not present

## 2016-07-20 DIAGNOSIS — H269 Unspecified cataract: Secondary | ICD-10-CM | POA: Diagnosis not present

## 2016-07-20 DIAGNOSIS — I4891 Unspecified atrial fibrillation: Secondary | ICD-10-CM | POA: Diagnosis not present

## 2016-07-20 DIAGNOSIS — H2181 Floppy iris syndrome: Secondary | ICD-10-CM | POA: Diagnosis not present

## 2016-07-20 DIAGNOSIS — H25011 Cortical age-related cataract, right eye: Secondary | ICD-10-CM | POA: Diagnosis not present

## 2016-07-20 DIAGNOSIS — E7800 Pure hypercholesterolemia, unspecified: Secondary | ICD-10-CM | POA: Diagnosis not present

## 2016-07-20 DIAGNOSIS — E039 Hypothyroidism, unspecified: Secondary | ICD-10-CM | POA: Diagnosis not present

## 2016-07-20 DIAGNOSIS — H4301 Vitreous prolapse, right eye: Secondary | ICD-10-CM | POA: Diagnosis not present

## 2016-08-17 DIAGNOSIS — H59011 Keratopathy (bullous aphakic) following cataract surgery, right eye: Secondary | ICD-10-CM | POA: Diagnosis not present

## 2016-08-17 DIAGNOSIS — E7800 Pure hypercholesterolemia, unspecified: Secondary | ICD-10-CM | POA: Diagnosis not present

## 2016-08-17 DIAGNOSIS — I4891 Unspecified atrial fibrillation: Secondary | ICD-10-CM | POA: Diagnosis not present

## 2016-08-17 DIAGNOSIS — Z79899 Other long term (current) drug therapy: Secondary | ICD-10-CM | POA: Diagnosis not present

## 2016-08-17 DIAGNOSIS — H4301 Vitreous prolapse, right eye: Secondary | ICD-10-CM | POA: Diagnosis not present

## 2016-08-17 DIAGNOSIS — G4733 Obstructive sleep apnea (adult) (pediatric): Secondary | ICD-10-CM | POA: Diagnosis not present

## 2016-08-17 DIAGNOSIS — E039 Hypothyroidism, unspecified: Secondary | ICD-10-CM | POA: Diagnosis not present

## 2016-08-22 DIAGNOSIS — L814 Other melanin hyperpigmentation: Secondary | ICD-10-CM | POA: Diagnosis not present

## 2016-08-22 DIAGNOSIS — C44319 Basal cell carcinoma of skin of other parts of face: Secondary | ICD-10-CM | POA: Diagnosis not present

## 2016-08-22 DIAGNOSIS — L570 Actinic keratosis: Secondary | ICD-10-CM | POA: Diagnosis not present

## 2016-08-22 DIAGNOSIS — L919 Hypertrophic disorder of the skin, unspecified: Secondary | ICD-10-CM | POA: Diagnosis not present

## 2016-08-22 DIAGNOSIS — L821 Other seborrheic keratosis: Secondary | ICD-10-CM | POA: Diagnosis not present

## 2016-08-22 DIAGNOSIS — D485 Neoplasm of uncertain behavior of skin: Secondary | ICD-10-CM | POA: Diagnosis not present

## 2016-08-22 DIAGNOSIS — Z08 Encounter for follow-up examination after completed treatment for malignant neoplasm: Secondary | ICD-10-CM | POA: Diagnosis not present

## 2016-08-22 DIAGNOSIS — Z85828 Personal history of other malignant neoplasm of skin: Secondary | ICD-10-CM | POA: Diagnosis not present

## 2016-08-28 DIAGNOSIS — I482 Chronic atrial fibrillation: Secondary | ICD-10-CM | POA: Diagnosis not present

## 2016-10-19 DIAGNOSIS — C44319 Basal cell carcinoma of skin of other parts of face: Secondary | ICD-10-CM | POA: Diagnosis not present

## 2016-10-19 DIAGNOSIS — L570 Actinic keratosis: Secondary | ICD-10-CM | POA: Diagnosis not present

## 2016-11-01 DIAGNOSIS — M1711 Unilateral primary osteoarthritis, right knee: Secondary | ICD-10-CM | POA: Diagnosis not present

## 2016-11-01 DIAGNOSIS — M25569 Pain in unspecified knee: Secondary | ICD-10-CM | POA: Diagnosis not present

## 2016-11-30 DIAGNOSIS — M1711 Unilateral primary osteoarthritis, right knee: Secondary | ICD-10-CM | POA: Diagnosis not present

## 2016-11-30 DIAGNOSIS — M25561 Pain in right knee: Secondary | ICD-10-CM | POA: Diagnosis not present

## 2016-12-21 DIAGNOSIS — H26493 Other secondary cataract, bilateral: Secondary | ICD-10-CM | POA: Diagnosis not present

## 2017-01-19 DIAGNOSIS — M1711 Unilateral primary osteoarthritis, right knee: Secondary | ICD-10-CM | POA: Diagnosis not present

## 2017-01-19 DIAGNOSIS — M25569 Pain in unspecified knee: Secondary | ICD-10-CM | POA: Diagnosis not present

## 2017-01-29 DIAGNOSIS — S83241A Other tear of medial meniscus, current injury, right knee, initial encounter: Secondary | ICD-10-CM | POA: Diagnosis not present

## 2017-01-29 DIAGNOSIS — M23303 Other meniscus derangements, unspecified medial meniscus, right knee: Secondary | ICD-10-CM | POA: Diagnosis not present

## 2017-01-29 DIAGNOSIS — M1711 Unilateral primary osteoarthritis, right knee: Secondary | ICD-10-CM | POA: Diagnosis not present

## 2017-01-29 DIAGNOSIS — M25461 Effusion, right knee: Secondary | ICD-10-CM | POA: Diagnosis not present

## 2017-02-02 DIAGNOSIS — M25561 Pain in right knee: Secondary | ICD-10-CM | POA: Diagnosis not present

## 2017-02-02 DIAGNOSIS — M1711 Unilateral primary osteoarthritis, right knee: Secondary | ICD-10-CM | POA: Diagnosis not present

## 2017-02-09 DIAGNOSIS — E039 Hypothyroidism, unspecified: Secondary | ICD-10-CM | POA: Diagnosis not present

## 2017-02-09 DIAGNOSIS — C329 Malignant neoplasm of larynx, unspecified: Secondary | ICD-10-CM | POA: Diagnosis not present

## 2017-02-09 DIAGNOSIS — Z23 Encounter for immunization: Secondary | ICD-10-CM | POA: Diagnosis not present

## 2017-02-09 DIAGNOSIS — I4891 Unspecified atrial fibrillation: Secondary | ICD-10-CM | POA: Diagnosis not present

## 2017-02-09 DIAGNOSIS — M1990 Unspecified osteoarthritis, unspecified site: Secondary | ICD-10-CM | POA: Diagnosis not present

## 2017-02-09 DIAGNOSIS — R7301 Impaired fasting glucose: Secondary | ICD-10-CM | POA: Diagnosis not present

## 2017-02-09 DIAGNOSIS — E7800 Pure hypercholesterolemia, unspecified: Secondary | ICD-10-CM | POA: Diagnosis not present

## 2017-03-14 DIAGNOSIS — M2391 Unspecified internal derangement of right knee: Secondary | ICD-10-CM | POA: Diagnosis not present

## 2017-03-20 DIAGNOSIS — M1711 Unilateral primary osteoarthritis, right knee: Secondary | ICD-10-CM | POA: Diagnosis not present

## 2017-03-20 DIAGNOSIS — M23231 Derangement of other medial meniscus due to old tear or injury, right knee: Secondary | ICD-10-CM | POA: Diagnosis not present

## 2017-03-20 DIAGNOSIS — S83241A Other tear of medial meniscus, current injury, right knee, initial encounter: Secondary | ICD-10-CM | POA: Diagnosis not present

## 2017-03-20 DIAGNOSIS — M2391 Unspecified internal derangement of right knee: Secondary | ICD-10-CM | POA: Diagnosis not present

## 2017-03-20 DIAGNOSIS — M948X6 Other specified disorders of cartilage, lower leg: Secondary | ICD-10-CM | POA: Diagnosis not present

## 2017-03-20 DIAGNOSIS — M6751 Plica syndrome, right knee: Secondary | ICD-10-CM | POA: Diagnosis not present

## 2017-03-28 DIAGNOSIS — M25561 Pain in right knee: Secondary | ICD-10-CM | POA: Diagnosis not present

## 2017-04-12 DIAGNOSIS — Z08 Encounter for follow-up examination after completed treatment for malignant neoplasm: Secondary | ICD-10-CM | POA: Diagnosis not present

## 2017-04-12 DIAGNOSIS — L821 Other seborrheic keratosis: Secondary | ICD-10-CM | POA: Diagnosis not present

## 2017-04-12 DIAGNOSIS — Z85828 Personal history of other malignant neoplasm of skin: Secondary | ICD-10-CM | POA: Diagnosis not present

## 2017-04-12 DIAGNOSIS — L814 Other melanin hyperpigmentation: Secondary | ICD-10-CM | POA: Diagnosis not present

## 2017-04-12 DIAGNOSIS — L570 Actinic keratosis: Secondary | ICD-10-CM | POA: Diagnosis not present

## 2017-04-16 DIAGNOSIS — J383 Other diseases of vocal cords: Secondary | ICD-10-CM | POA: Diagnosis not present

## 2017-04-16 DIAGNOSIS — R040 Epistaxis: Secondary | ICD-10-CM | POA: Diagnosis not present

## 2017-04-16 DIAGNOSIS — I4891 Unspecified atrial fibrillation: Secondary | ICD-10-CM | POA: Diagnosis not present

## 2017-05-17 DIAGNOSIS — M21622 Bunionette of left foot: Secondary | ICD-10-CM | POA: Diagnosis not present

## 2017-08-23 DIAGNOSIS — H524 Presbyopia: Secondary | ICD-10-CM | POA: Diagnosis not present

## 2017-08-23 DIAGNOSIS — D3131 Benign neoplasm of right choroid: Secondary | ICD-10-CM | POA: Diagnosis not present

## 2017-08-23 DIAGNOSIS — D3132 Benign neoplasm of left choroid: Secondary | ICD-10-CM | POA: Diagnosis not present

## 2017-08-27 DIAGNOSIS — I482 Chronic atrial fibrillation: Secondary | ICD-10-CM | POA: Diagnosis not present

## 2017-08-27 DIAGNOSIS — Z6823 Body mass index (BMI) 23.0-23.9, adult: Secondary | ICD-10-CM | POA: Diagnosis not present

## 2017-12-18 DIAGNOSIS — Z23 Encounter for immunization: Secondary | ICD-10-CM | POA: Diagnosis not present

## 2018-02-11 DIAGNOSIS — M1990 Unspecified osteoarthritis, unspecified site: Secondary | ICD-10-CM | POA: Diagnosis not present

## 2018-02-11 DIAGNOSIS — Z0000 Encounter for general adult medical examination without abnormal findings: Secondary | ICD-10-CM | POA: Diagnosis not present

## 2018-02-11 DIAGNOSIS — E039 Hypothyroidism, unspecified: Secondary | ICD-10-CM | POA: Diagnosis not present

## 2018-02-11 DIAGNOSIS — R7301 Impaired fasting glucose: Secondary | ICD-10-CM | POA: Diagnosis not present

## 2018-02-11 DIAGNOSIS — E7800 Pure hypercholesterolemia, unspecified: Secondary | ICD-10-CM | POA: Diagnosis not present

## 2018-02-11 DIAGNOSIS — Z1211 Encounter for screening for malignant neoplasm of colon: Secondary | ICD-10-CM | POA: Diagnosis not present

## 2018-02-11 DIAGNOSIS — Z8521 Personal history of malignant neoplasm of larynx: Secondary | ICD-10-CM | POA: Diagnosis not present

## 2018-02-11 DIAGNOSIS — I4891 Unspecified atrial fibrillation: Secondary | ICD-10-CM | POA: Diagnosis not present

## 2018-02-27 DIAGNOSIS — L814 Other melanin hyperpigmentation: Secondary | ICD-10-CM | POA: Diagnosis not present

## 2018-02-27 DIAGNOSIS — L570 Actinic keratosis: Secondary | ICD-10-CM | POA: Diagnosis not present

## 2018-02-27 DIAGNOSIS — Z08 Encounter for follow-up examination after completed treatment for malignant neoplasm: Secondary | ICD-10-CM | POA: Diagnosis not present

## 2018-02-27 DIAGNOSIS — Z85828 Personal history of other malignant neoplasm of skin: Secondary | ICD-10-CM | POA: Diagnosis not present

## 2018-02-27 DIAGNOSIS — L821 Other seborrheic keratosis: Secondary | ICD-10-CM | POA: Diagnosis not present

## 2018-07-02 DIAGNOSIS — M170 Bilateral primary osteoarthritis of knee: Secondary | ICD-10-CM | POA: Diagnosis not present

## 2018-07-02 DIAGNOSIS — M25561 Pain in right knee: Secondary | ICD-10-CM | POA: Diagnosis not present

## 2018-07-02 DIAGNOSIS — M25562 Pain in left knee: Secondary | ICD-10-CM | POA: Diagnosis not present

## 2018-08-16 DIAGNOSIS — Z23 Encounter for immunization: Secondary | ICD-10-CM | POA: Diagnosis not present

## 2018-08-20 DIAGNOSIS — M170 Bilateral primary osteoarthritis of knee: Secondary | ICD-10-CM | POA: Diagnosis not present

## 2018-09-26 DIAGNOSIS — L821 Other seborrheic keratosis: Secondary | ICD-10-CM | POA: Diagnosis not present

## 2018-09-26 DIAGNOSIS — Z85828 Personal history of other malignant neoplasm of skin: Secondary | ICD-10-CM | POA: Diagnosis not present

## 2018-09-26 DIAGNOSIS — L84 Corns and callosities: Secondary | ICD-10-CM | POA: Diagnosis not present

## 2018-09-26 DIAGNOSIS — Z08 Encounter for follow-up examination after completed treatment for malignant neoplasm: Secondary | ICD-10-CM | POA: Diagnosis not present

## 2018-09-26 DIAGNOSIS — L570 Actinic keratosis: Secondary | ICD-10-CM | POA: Diagnosis not present

## 2018-09-26 DIAGNOSIS — L814 Other melanin hyperpigmentation: Secondary | ICD-10-CM | POA: Diagnosis not present

## 2019-01-17 DIAGNOSIS — L97512 Non-pressure chronic ulcer of other part of right foot with fat layer exposed: Secondary | ICD-10-CM | POA: Diagnosis not present

## 2019-01-17 DIAGNOSIS — M79674 Pain in right toe(s): Secondary | ICD-10-CM | POA: Diagnosis not present

## 2019-01-17 DIAGNOSIS — M2041 Other hammer toe(s) (acquired), right foot: Secondary | ICD-10-CM | POA: Diagnosis not present

## 2019-01-31 DIAGNOSIS — L97512 Non-pressure chronic ulcer of other part of right foot with fat layer exposed: Secondary | ICD-10-CM | POA: Diagnosis not present

## 2019-02-14 DIAGNOSIS — Z8521 Personal history of malignant neoplasm of larynx: Secondary | ICD-10-CM | POA: Diagnosis not present

## 2019-02-14 DIAGNOSIS — Z1389 Encounter for screening for other disorder: Secondary | ICD-10-CM | POA: Diagnosis not present

## 2019-02-14 DIAGNOSIS — R7301 Impaired fasting glucose: Secondary | ICD-10-CM | POA: Diagnosis not present

## 2019-02-14 DIAGNOSIS — I4891 Unspecified atrial fibrillation: Secondary | ICD-10-CM | POA: Diagnosis not present

## 2019-02-14 DIAGNOSIS — E7800 Pure hypercholesterolemia, unspecified: Secondary | ICD-10-CM | POA: Diagnosis not present

## 2019-02-14 DIAGNOSIS — Z79899 Other long term (current) drug therapy: Secondary | ICD-10-CM | POA: Diagnosis not present

## 2019-02-14 DIAGNOSIS — M71571 Other bursitis, not elsewhere classified, right ankle and foot: Secondary | ICD-10-CM | POA: Diagnosis not present

## 2019-02-14 DIAGNOSIS — M1990 Unspecified osteoarthritis, unspecified site: Secondary | ICD-10-CM | POA: Diagnosis not present

## 2019-02-14 DIAGNOSIS — Z1211 Encounter for screening for malignant neoplasm of colon: Secondary | ICD-10-CM | POA: Diagnosis not present

## 2019-02-14 DIAGNOSIS — E039 Hypothyroidism, unspecified: Secondary | ICD-10-CM | POA: Diagnosis not present

## 2019-02-14 DIAGNOSIS — Z0000 Encounter for general adult medical examination without abnormal findings: Secondary | ICD-10-CM | POA: Diagnosis not present

## 2019-02-17 DIAGNOSIS — Z1211 Encounter for screening for malignant neoplasm of colon: Secondary | ICD-10-CM | POA: Diagnosis not present

## 2019-02-23 ENCOUNTER — Ambulatory Visit: Payer: Medicare HMO | Attending: Internal Medicine

## 2019-02-23 DIAGNOSIS — Z23 Encounter for immunization: Secondary | ICD-10-CM | POA: Insufficient documentation

## 2019-02-23 NOTE — Progress Notes (Signed)
   Covid-19 Vaccination Clinic  Name:  William Chung    MRN: [id number] DOB: 17-Aug-1940  02/23/2019  William Chung was observed post Covid-19 immunization for 15 minutes without incidence. He was provided with Vaccine Information Sheet and instruction to access the V-Safe system.   William Chung was instructed to call 911 with any severe reactions post vaccine: [name] Kitchen Difficulty breathing  . Swelling of your face and throat  . A fast heartbeat  . A bad rash all over your body  . Dizziness and weakness    Immunizations Administered    Name Date Dose VIS Date Route   Pfizer COVID-19 Vaccine 02/23/2019  9:34 AM 0.3 mL 12/13/2018 Intramuscular   Manufacturer: [place]   Lot: [id number]   [place]: [id number]

## 2019-03-19 ENCOUNTER — Ambulatory Visit: Payer: PRIVATE HEALTH INSURANCE | Attending: Internal Medicine

## 2019-03-19 DIAGNOSIS — Z23 Encounter for immunization: Secondary | ICD-10-CM

## 2019-03-19 NOTE — Progress Notes (Signed)
   Covid-19 Vaccination Clinic  Name:  William Chung    MRN: [id number] DOB: [birth year]  03/19/2019  [name] was observed post Covid-19 immunization for 15 minutes without incident. He was provided with Vaccine Information Sheet and instruction to access the V-Safe system.   [name] was instructed to call 911 with any severe reactions post vaccine: [name] Kitchen Difficulty breathing  . Swelling of face and throat  . A fast heartbeat  . A bad rash all over body  . Dizziness and weakness   Immunizations Administered    Name Date Dose VIS Date Route   Pfizer COVID-19 Vaccine 03/19/2019  8:24 AM 0.3 mL 12/13/2018 Intramuscular   Manufacturer: [place]   Lot: [id number]   [place]: [id number]

## 2019-03-27 ENCOUNTER — Telehealth: Payer: Self-pay | Admitting: *Deleted

## 2019-03-27 DIAGNOSIS — Z1211 Encounter for screening for malignant neoplasm of colon: Secondary | ICD-10-CM | POA: Diagnosis not present

## 2019-03-27 DIAGNOSIS — R195 Other fecal abnormalities: Secondary | ICD-10-CM | POA: Diagnosis not present

## 2019-03-27 NOTE — Telephone Encounter (Signed)
   [facility] Pre-operative Risk Assessment    Request for surgical clearance: PT HAS APPT 04/16/19 WITH DR. Marlou Porch  1. What type of surgery is being performed? COLONOSCOPY (REASON IS DUE TO PT HAD A POSITIVE FIT TEST)  2. When is this surgery scheduled? 05/05/19   3. What type of clearance is required (medical clearance vs. Pharmacy clearance to hold med vs. Both)? BOTH  4. Are there any medications that need to be held prior to surgery and how long? ELIQUIS   5. Practice name and name of physician performing surgery? [facility]; DR. MAGOD   6. What is your office phone number [phone]    7.   What is your office fax number [phone]  8.   Anesthesia type (None, local, MAC, general) ? NOT LISTED; PROPOFOL?    Julaine Hua 03/27/2019, 2:56 PM  _________________________________________________________________   (provider comments below)

## 2019-03-27 NOTE — Telephone Encounter (Signed)
I will forward clearance notes to Dr. Marlou Porch for upcoming appt. I will send FYI to DR. Magod pt has New Pt appt with Dr. Marlou Porch as we have not seen the pt since 2015. I will remove from the pre op call back pool.

## 2019-03-27 NOTE — Telephone Encounter (Signed)
The patient has not been seen in our office since 2015. He has been following with a cardiologist in [place], but he is now retired. William Chung has an appt to get re-established with Dr. Marlou Porch on 04/16/19 at which time clearance will be addressed.  I will route this to Dr. Marlou Porch.   Pre-op call back please let the requesting provider know that clearance will be addressed at office visit on 4/14.   I will remove this from the preop box.

## 2019-04-16 ENCOUNTER — Encounter: Payer: Self-pay | Admitting: Cardiology

## 2019-04-16 ENCOUNTER — Ambulatory Visit (INDEPENDENT_AMBULATORY_CARE_PROVIDER_SITE_OTHER): Payer: Medicare HMO | Admitting: Cardiology

## 2019-04-16 ENCOUNTER — Other Ambulatory Visit: Payer: Self-pay

## 2019-04-16 ENCOUNTER — Telehealth: Payer: Self-pay | Admitting: *Deleted

## 2019-04-16 VITALS — BP 128/78 | HR 89 | Ht 72.0 in | Wt 173.4 lb

## 2019-04-16 DIAGNOSIS — Z7901 Long term (current) use of anticoagulants: Secondary | ICD-10-CM | POA: Diagnosis not present

## 2019-04-16 DIAGNOSIS — I482 Chronic atrial fibrillation, unspecified: Secondary | ICD-10-CM

## 2019-04-16 NOTE — Patient Instructions (Signed)
Medication Instructions:  The current medical regimen is effective;  continue present plan and medications.  *If you need a refill on your cardiac medications before your next appointment, please call your pharmacy*  Follow-Up: At [facility], you and your health needs are our priority.  As part of our continuing mission to provide you with exceptional heart care, we have created designated Provider Care Teams.  These Care Teams include your primary Cardiologist (physician) and Advanced Practice Providers (APPs -  Physician Assistants and Nurse Practitioners) who all work together to provide you with the care you need, when you need it.  We recommend signing up for the patient portal called "MyChart".  Sign up information is provided on this After Visit Summary.  MyChart is used to connect with patients for Virtual Visits (Telemedicine).  Patients are able to view lab/test results, encounter notes, upcoming appointments, etc.  Non-urgent messages can be sent to your provider as well.   To learn more about what you can do with MyChart, go to [email or web address].    Your next appointment:   12 month(s)  The format for your next appointment:   In Person  Provider:   Candee Furbish, MD   You have move forward with your colonoscopy.  Please hold your Eliquis 2 days before and resume the day after.

## 2019-04-16 NOTE — Progress Notes (Signed)
Cardiology Office Note:    Date:  04/16/2019   ID:  William Chung, DOB [birth year], MRN [id number]  PCP:  Gaynelle Arabian, MD  Cardiologist:  No primary care provider on file.  Electrophysiologist:  None   Referring MD: Gaynelle Arabian, MD     History of Present Illness:    William Chung is a 79 y.o. male here for preoperative or stratification.  In review of Dr. Watt Climes note for colonoscopy he states that "he will see his new cardiologist the second week of April and hopefully he will let him hold his blood thinner for a few days before his colonoscopy which we will go ahead and schedule in May per patient preference.  He is asymptomatic from a GI standpoint no problems previously.  He has a history of atrial fibrillation hyperlipidemia.  His father died age 40 from pneumonia mother died at 56 from dementia.  He has had his right knee replaced.  Used to smoke quit in 1990 currently on Eliquis 5 mg twice a day and simvastatin 20 mg a day.    I saw him last in 2015.  At that time he had rate controlled atrial fibrillation.  His EKG reviewed from 02/04/2013 showed A. fib heart rate 81 bpm with poor R wave progression.  Overall at that time he was enjoying golf playing and bald head, has a house at [facility] carries his own bag.  Fairly asymptomatic.  Was seeing Dr. Ivan Anchors in [place].  He has retired.  He did have a Cologuard that was abnormal.  Past Medical History:  Diagnosis Date  . Atrial fibrillation ([place])   . Hematuria   . Hypercholesterolemia   . Hypothyroid   . Shingles   . Squamous cell cancer of hypopharynx (HCC)    , vocal cords    Past Surgical History:  Procedure Laterality Date  . INGUINAL HERNIA REPAIR Right   . LAMINECTOMY    . POLYPECTOMY     , Vocal Cords    Current Medications: Current Meds  Medication Sig  . acetaminophen (TYLENOL) 500 MG tablet Take 500 mg by mouth every 6 (six) hours as needed for headache.  [name] Kitchen ELIQUIS 5 MG  TABS tablet Take 5 mg by mouth in the morning and at bedtime.  [name] Kitchen levothyroxine (SYNTHROID, LEVOTHROID) 50 MCG tablet Take 50 mcg by mouth daily before breakfast.  . lisinopril (PRINIVIL,ZESTRIL) 5 MG tablet TAKE 1 TABLET BY MOUTH DAILY  . simvastatin (ZOCOR) 20 MG tablet Take 20 mg by mouth daily.     Allergies:   Patient has no allergy information on record.   Social History   Socioeconomic History  . Marital status: Married    Spouse name: Not on file  . Number of children: Not on file  . Years of education: Not on file  . Highest education level: Not on file  Occupational History  . Not on file  Tobacco Use  . Smoking status: Former Research scientist (life sciences)  . Smokeless tobacco: Never Used  Substance and Sexual Activity  . Alcohol use: Yes    Comment: VERY LITTLE BEER, ONLY DRINKS WINE AND LIQUOR  . Drug use: No  . Sexual activity: Not on file  Chung Topics Concern  . Not on file  Social History Narrative  . Not on file   Social Determinants of Health   Financial Resource Strain:   . Difficulty of Paying Living Expenses:   Food Insecurity:   . Worried About Estate manager/land agent  of Food in the Last Year:   . [place] in the Last Year:   Transportation Needs:   . Lack of Transportation (Medical):   [name] Kitchen Lack of Transportation (Non-Medical):   Physical Activity:   . Days of Exercise per Week:   . Minutes of Exercise per Session:   Stress:   . Feeling of Stress :   Social Connections:   . Frequency of Communication with Friends and Family:   . Frequency of Social Gatherings with Friends and Family:   . Attends Religious Services:   . Active Member of Clubs or Organizations:   . Attends Archivist Meetings:   [name] Kitchen Marital Status:      Family History: The patient's family history includes Congestive Heart Failure in his mother; Dementia in his mother; Pneumonia in his father.  ROS:   Please see the history of present illness.    No fevers chills nausea vomiting syncope  bleeding all Chung systems reviewed and are negative.  EKGs/Labs/Chung Studies Reviewed:    The following studies were reviewed today: Prior office note reviewed prior echo with normal EF  EKG:  EKG is  ordered today.  The ekg ordered today demonstrates A. fib 89 PVC  Recent Labs: No results found for requested labs within last 8760 hours.  Recent Lipid Panel No results found for: CHOL, TRIG, HDL, CHOLHDL, VLDL, LDLCALC, LDLDIRECT  Physical Exam:    VS:  BP 128/78   Pulse 89   Ht 6' (1.829 m)   Wt 173 lb 6.4 oz (78.7 kg)   SpO2 98%   BMI 23.52 kg/m     Wt Readings from Last 3 Encounters:  04/16/19 173 lb 6.4 oz (78.7 kg)  04/03/13 183 lb (83 kg)  02/20/13 185 lb (83.9 kg)     GEN:  Well nourished, well developed in no acute distress HEENT: Normal NECK: No JVD; No carotid bruits LYMPHATICS: No lymphadenopathy CARDIAC: Irregularly irregular, no murmurs, rubs, gallops RESPIRATORY:  Clear to auscultation without rales, wheezing or rhonchi  ABDOMEN: Soft, non-tender, non-distended MUSCULOSKELETAL:  No edema; No deformity  SKIN: Warm and dry, Tan NEUROLOGIC:  Alert and oriented x 3 PSYCHIATRIC:  Normal affect   ASSESSMENT:    1. Chronic atrial fibrillation ([place])   2. Chronic anticoagulation    PLAN:    In order of problems listed above:  Persistent atrial fibrillation -Asymptomatic.  Overall good rate control.  He has been taking Eliquis.  EKG shows heart rate of 89 bpm.  Overall no changes.  Continue to enjoy golf.  Chronic anticoagulation -CHA2DS2-VASc of 2 -Continue with Eliquis 5 twice a day.  PVC -Noted on ECG-benign.  Hyperlipidemia -Continue with simvastatin LDL from outside lab 79 HDL 93 hemoglobin A1c 5.9 creatinine 0.97 potassium 4.6 ALT 15.  Preop colonoscopy -He may proceed with colonoscopy and hold his Eliquis for 2 days prior and resume day after colonoscopy.       Medication Adjustments/Labs and Tests Ordered: Current medicines are  reviewed at length with the patient today.  Concerns regarding medicines are outlined above.  Orders Placed This Encounter  Procedures  . EKG 12-Lead   No orders of the defined types were placed in this encounter.   Patient Instructions  Medication Instructions:  The current medical regimen is effective;  continue present plan and medications.  *If you need a refill on your cardiac medications before your next appointment, please call your pharmacy*  Follow-Up: At [facility], you and your  health needs are our priority.  As part of our continuing mission to provide you with exceptional heart care, we have created designated Provider Care Teams.  These Care Teams include your primary Cardiologist (physician) and Advanced Practice Providers (APPs -  Physician Assistants and Nurse Practitioners) who all work together to provide you with the care you need, when you need it.  We recommend signing up for the patient portal called "MyChart".  Sign up information is provided on this After Visit Summary.  MyChart is used to connect with patients for Virtual Visits (Telemedicine).  Patients are able to view lab/test results, encounter notes, upcoming appointments, etc.  Non-urgent messages can be sent to your provider as well.   To learn more about what you can do with MyChart, go to [email or web address].    Your next appointment:   12 month(s)  The format for your next appointment:   In Person  Provider:   Candee Furbish, MD   You have move forward with your colonoscopy.  Please hold your Eliquis 2 days before and resume the day after.    Signed, Candee Furbish, MD  04/16/2019 11:23 AM    [facility]

## 2019-04-16 NOTE — Telephone Encounter (Signed)
   [facility] Pre-operative Risk Assessment    Request for surgical clearance: PT HAS APPT 04/16/19 WITH DR. Marlou Porch  1. What type of surgery is being performed? COLONOSCOPY (REASON IS DUE TO PT HAD A POSITIVE FIT TEST)  2. When is this surgery scheduled? 05/05/19   3. What type of clearance is required (medical clearance vs. Pharmacy clearance to hold med vs. Both)? BOTH  4. Are there any medications that need to be held prior to surgery and how long? ELIQUIS   5. Practice name and name of physician performing surgery? [facility]; DR. MAGOD   6. What is your office phone number [phone]    7.   What is your office fax number [phone]  8.   Anesthesia type (None, local, MAC, general) ? NOT LISTED; PROPOFOL?    Per Dr Marlou Porch - pt has been cleared to move forward with his upcoming colonoscopy.  He will hold Eliquis 2 days prior and resume the next day after.  If questions please call - Thank you

## 2019-04-23 DIAGNOSIS — L814 Other melanin hyperpigmentation: Secondary | ICD-10-CM | POA: Diagnosis not present

## 2019-04-23 DIAGNOSIS — L821 Other seborrheic keratosis: Secondary | ICD-10-CM | POA: Diagnosis not present

## 2019-04-23 DIAGNOSIS — Z85828 Personal history of other malignant neoplasm of skin: Secondary | ICD-10-CM | POA: Diagnosis not present

## 2019-04-23 DIAGNOSIS — L570 Actinic keratosis: Secondary | ICD-10-CM | POA: Diagnosis not present

## 2019-04-23 DIAGNOSIS — Z08 Encounter for follow-up examination after completed treatment for malignant neoplasm: Secondary | ICD-10-CM | POA: Diagnosis not present

## 2019-04-23 DIAGNOSIS — L84 Corns and callosities: Secondary | ICD-10-CM | POA: Diagnosis not present

## 2019-04-30 DIAGNOSIS — Z1159 Encounter for screening for other viral diseases: Secondary | ICD-10-CM | POA: Diagnosis not present

## 2019-05-05 DIAGNOSIS — K5520 Angiodysplasia of colon without hemorrhage: Secondary | ICD-10-CM | POA: Diagnosis not present

## 2019-05-05 DIAGNOSIS — Z1211 Encounter for screening for malignant neoplasm of colon: Secondary | ICD-10-CM | POA: Diagnosis not present

## 2019-05-05 DIAGNOSIS — D120 Benign neoplasm of cecum: Secondary | ICD-10-CM | POA: Diagnosis not present

## 2019-05-05 DIAGNOSIS — K635 Polyp of colon: Secondary | ICD-10-CM | POA: Diagnosis not present

## 2019-05-05 DIAGNOSIS — K5730 Diverticulosis of large intestine without perforation or abscess without bleeding: Secondary | ICD-10-CM | POA: Diagnosis not present

## 2019-05-07 DIAGNOSIS — D120 Benign neoplasm of cecum: Secondary | ICD-10-CM | POA: Diagnosis not present

## 2019-05-07 DIAGNOSIS — K635 Polyp of colon: Secondary | ICD-10-CM | POA: Diagnosis not present

## 2019-07-17 DIAGNOSIS — H524 Presbyopia: Secondary | ICD-10-CM | POA: Diagnosis not present

## 2019-07-17 DIAGNOSIS — D3131 Benign neoplasm of right choroid: Secondary | ICD-10-CM | POA: Diagnosis not present

## 2019-07-17 DIAGNOSIS — H31003 Unspecified chorioretinal scars, bilateral: Secondary | ICD-10-CM | POA: Diagnosis not present

## 2019-08-21 DIAGNOSIS — M66821 Spontaneous rupture of other tendons, right upper arm: Secondary | ICD-10-CM | POA: Diagnosis not present

## 2019-08-21 DIAGNOSIS — M25511 Pain in right shoulder: Secondary | ICD-10-CM | POA: Diagnosis not present

## 2019-08-25 DIAGNOSIS — Z872 Personal history of diseases of the skin and subcutaneous tissue: Secondary | ICD-10-CM | POA: Diagnosis not present

## 2019-08-25 DIAGNOSIS — Z08 Encounter for follow-up examination after completed treatment for malignant neoplasm: Secondary | ICD-10-CM | POA: Diagnosis not present

## 2019-08-25 DIAGNOSIS — Z85828 Personal history of other malignant neoplasm of skin: Secondary | ICD-10-CM | POA: Diagnosis not present

## 2019-08-25 DIAGNOSIS — L570 Actinic keratosis: Secondary | ICD-10-CM | POA: Diagnosis not present

## 2019-08-25 DIAGNOSIS — Z09 Encounter for follow-up examination after completed treatment for conditions other than malignant neoplasm: Secondary | ICD-10-CM | POA: Diagnosis not present

## 2019-12-11 DIAGNOSIS — M71571 Other bursitis, not elsewhere classified, right ankle and foot: Secondary | ICD-10-CM | POA: Diagnosis not present

## 2020-01-21 DIAGNOSIS — L814 Other melanin hyperpigmentation: Secondary | ICD-10-CM | POA: Diagnosis not present

## 2020-01-21 DIAGNOSIS — D485 Neoplasm of uncertain behavior of skin: Secondary | ICD-10-CM | POA: Diagnosis not present

## 2020-01-21 DIAGNOSIS — C44311 Basal cell carcinoma of skin of nose: Secondary | ICD-10-CM | POA: Diagnosis not present

## 2020-01-21 DIAGNOSIS — L821 Other seborrheic keratosis: Secondary | ICD-10-CM | POA: Diagnosis not present

## 2020-01-21 DIAGNOSIS — L298 Other pruritus: Secondary | ICD-10-CM | POA: Diagnosis not present

## 2020-01-21 DIAGNOSIS — Z08 Encounter for follow-up examination after completed treatment for malignant neoplasm: Secondary | ICD-10-CM | POA: Diagnosis not present

## 2020-01-21 DIAGNOSIS — L820 Inflamed seborrheic keratosis: Secondary | ICD-10-CM | POA: Diagnosis not present

## 2020-01-21 DIAGNOSIS — L538 Other specified erythematous conditions: Secondary | ICD-10-CM | POA: Diagnosis not present

## 2020-01-21 DIAGNOSIS — D225 Melanocytic nevi of trunk: Secondary | ICD-10-CM | POA: Diagnosis not present

## 2020-01-21 DIAGNOSIS — Z85828 Personal history of other malignant neoplasm of skin: Secondary | ICD-10-CM | POA: Diagnosis not present

## 2020-02-19 ENCOUNTER — Encounter: Payer: Self-pay | Admitting: *Deleted

## 2020-02-19 DIAGNOSIS — Z1389 Encounter for screening for other disorder: Secondary | ICD-10-CM | POA: Diagnosis not present

## 2020-02-19 DIAGNOSIS — I4891 Unspecified atrial fibrillation: Secondary | ICD-10-CM | POA: Diagnosis not present

## 2020-02-19 DIAGNOSIS — Z79899 Other long term (current) drug therapy: Secondary | ICD-10-CM | POA: Diagnosis not present

## 2020-02-19 DIAGNOSIS — E039 Hypothyroidism, unspecified: Secondary | ICD-10-CM | POA: Diagnosis not present

## 2020-02-19 DIAGNOSIS — Z8521 Personal history of malignant neoplasm of larynx: Secondary | ICD-10-CM | POA: Diagnosis not present

## 2020-02-19 DIAGNOSIS — D6869 Other thrombophilia: Secondary | ICD-10-CM | POA: Diagnosis not present

## 2020-02-19 DIAGNOSIS — R7301 Impaired fasting glucose: Secondary | ICD-10-CM | POA: Diagnosis not present

## 2020-02-19 DIAGNOSIS — E7800 Pure hypercholesterolemia, unspecified: Secondary | ICD-10-CM | POA: Diagnosis not present

## 2020-02-19 DIAGNOSIS — Z0000 Encounter for general adult medical examination without abnormal findings: Secondary | ICD-10-CM | POA: Diagnosis not present

## 2020-03-08 DIAGNOSIS — C44311 Basal cell carcinoma of skin of nose: Secondary | ICD-10-CM | POA: Diagnosis not present

## 2020-07-21 DIAGNOSIS — Z08 Encounter for follow-up examination after completed treatment for malignant neoplasm: Secondary | ICD-10-CM | POA: Diagnosis not present

## 2020-07-21 DIAGNOSIS — L821 Other seborrheic keratosis: Secondary | ICD-10-CM | POA: Diagnosis not present

## 2020-07-21 DIAGNOSIS — L570 Actinic keratosis: Secondary | ICD-10-CM | POA: Diagnosis not present

## 2020-07-21 DIAGNOSIS — Z85828 Personal history of other malignant neoplasm of skin: Secondary | ICD-10-CM | POA: Diagnosis not present

## 2020-07-21 DIAGNOSIS — L814 Other melanin hyperpigmentation: Secondary | ICD-10-CM | POA: Diagnosis not present

## 2020-07-21 DIAGNOSIS — D225 Melanocytic nevi of trunk: Secondary | ICD-10-CM | POA: Diagnosis not present

## 2020-07-26 DIAGNOSIS — Z923 Personal history of irradiation: Secondary | ICD-10-CM | POA: Diagnosis not present

## 2020-07-26 DIAGNOSIS — J3489 Other specified disorders of nose and nasal sinuses: Secondary | ICD-10-CM | POA: Diagnosis not present

## 2020-07-26 DIAGNOSIS — Z8521 Personal history of malignant neoplasm of larynx: Secondary | ICD-10-CM | POA: Diagnosis not present

## 2020-09-09 DIAGNOSIS — D485 Neoplasm of uncertain behavior of skin: Secondary | ICD-10-CM | POA: Diagnosis not present

## 2020-09-09 DIAGNOSIS — D0439 Carcinoma in situ of skin of other parts of face: Secondary | ICD-10-CM | POA: Diagnosis not present

## 2020-10-18 ENCOUNTER — Ambulatory Visit
Admission: RE | Admit: 2020-10-18 | Discharge: 2020-10-18 | Disposition: A | Discharge status: Home / Self Care | Payer: Medicare HMO | Source: Ambulatory Visit | Attending: Family Medicine | Admitting: Family Medicine

## 2020-10-18 ENCOUNTER — Other Ambulatory Visit: Payer: Self-pay | Admitting: Family Medicine

## 2020-10-18 DIAGNOSIS — J069 Acute upper respiratory infection, unspecified: Secondary | ICD-10-CM

## 2020-10-18 DIAGNOSIS — J029 Acute pharyngitis, unspecified: Secondary | ICD-10-CM | POA: Diagnosis not present

## 2020-10-18 DIAGNOSIS — R0981 Nasal congestion: Secondary | ICD-10-CM | POA: Diagnosis not present

## 2020-10-18 DIAGNOSIS — I517 Cardiomegaly: Secondary | ICD-10-CM | POA: Diagnosis not present

## 2020-10-18 DIAGNOSIS — R0989 Other specified symptoms and signs involving the circulatory and respiratory systems: Secondary | ICD-10-CM | POA: Diagnosis not present

## 2020-11-12 ENCOUNTER — Telehealth: Payer: Self-pay | Admitting: Cardiology

## 2020-11-12 NOTE — Telephone Encounter (Signed)
Patient called to talk only with Dr. Marlou Porch nurse. Please call back

## 2020-11-12 NOTE — Telephone Encounter (Signed)
Pt aware OK to hold Eliquis for a total of 2 days (today/tomorrow and restart Sunday)  he will c/b if any questions or concerns. 1 yr f/u appt scheduled.

## 2020-11-12 NOTE — Telephone Encounter (Signed)
Spoke with pt who is overdue to f/u. HX:  At Fib last seen 04/2019 and was to f/u in 1 year.  Take Eliquis 5 mg BID  Had recent gum surgery by Dr Geralynn Ochs without holding Eliquis.  Surgery was on Thursday 11/3 - bleed some that evening and had to go back in for a few more stitches.  Had no bleeding from 11/4 until yesterday.  Bleed alittle in the evening.  Was able to apply pressure to get it to stop.  No bleeding since.  Pt has not take Eliquis today.  Asking if OK to hold it for a day or 2 to make sure bleeding is under control.  Will forward to Dr Marlou Porch for review.

## 2020-11-12 NOTE — Telephone Encounter (Signed)
Okay to hold his Eliquis for the next 2 days to help settle down his gum bleeding. Candee Furbish, MD

## 2020-12-06 DIAGNOSIS — D0439 Carcinoma in situ of skin of other parts of face: Secondary | ICD-10-CM | POA: Diagnosis not present

## 2020-12-06 DIAGNOSIS — L570 Actinic keratosis: Secondary | ICD-10-CM | POA: Diagnosis not present

## 2020-12-10 DIAGNOSIS — T1511XA Foreign body in conjunctival sac, right eye, initial encounter: Secondary | ICD-10-CM | POA: Diagnosis not present

## 2020-12-10 DIAGNOSIS — H524 Presbyopia: Secondary | ICD-10-CM | POA: Diagnosis not present

## 2020-12-17 DIAGNOSIS — T1511XA Foreign body in conjunctival sac, right eye, initial encounter: Secondary | ICD-10-CM | POA: Diagnosis not present

## 2020-12-21 ENCOUNTER — Ambulatory Visit: Payer: Medicare HMO | Admitting: Cardiology

## 2021-04-07 ENCOUNTER — Encounter: Payer: Self-pay | Admitting: Cardiology

## 2021-04-07 ENCOUNTER — Ambulatory Visit (INDEPENDENT_AMBULATORY_CARE_PROVIDER_SITE_OTHER): Payer: Medicare (Managed Care) | Admitting: Cardiology

## 2021-04-07 DIAGNOSIS — I4821 Permanent atrial fibrillation: Secondary | ICD-10-CM

## 2021-04-07 DIAGNOSIS — D6869 Other thrombophilia: Secondary | ICD-10-CM | POA: Diagnosis not present

## 2021-04-07 DIAGNOSIS — I4891 Unspecified atrial fibrillation: Secondary | ICD-10-CM | POA: Insufficient documentation

## 2021-04-07 DIAGNOSIS — I4811 Longstanding persistent atrial fibrillation: Secondary | ICD-10-CM

## 2021-04-07 DIAGNOSIS — E7800 Pure hypercholesterolemia, unspecified: Secondary | ICD-10-CM

## 2021-04-07 DIAGNOSIS — R030 Elevated blood-pressure reading, without diagnosis of hypertension: Secondary | ICD-10-CM

## 2021-04-07 NOTE — Assessment & Plan Note (Signed)
Continue with simvastatin 20 mg a day.  Last LDL 70.  Wonderful.  HDL 82.  No myalgias. ?

## 2021-04-07 NOTE — Assessment & Plan Note (Signed)
Blood pressure little bit elevated today.  He will continue to monitor at home.  He used to be on lisinopril many years ago but this was stopped quite some time ago as well.  Has not needed any blood pressure medication.  Continue with daily exercise walking, enjoys golf. ?

## 2021-04-07 NOTE — Assessment & Plan Note (Signed)
Atrial fibrillation.  Eliquis 5 mg twice a day.  Continue ? ?Most recent blood work reassuring.  Creatinine 0.9 hemoglobin A1c 5.9 TSH 3.1 ALT 12. ?

## 2021-04-07 NOTE — Patient Instructions (Signed)
Medication Instructions:  ?The current medical regimen is effective;  continue present plan and medications. ? ?*If you need a refill on your cardiac medications before your next appointment, please call your pharmacy* ? ?Follow-Up: ?At [facility], you and your health needs are our priority.  As part of our continuing mission to provide you with exceptional heart care, we have created designated Provider Care Teams.  These Care Teams include your primary Cardiologist (physician) and Advanced Practice Providers (APPs -  Physician Assistants and Nurse Practitioners) who all work together to provide you with the care you need, when you need it. ? ?We recommend signing up for the patient portal called "MyChart".  Sign up information is provided on this After Visit Summary.  MyChart is used to connect with patients for Virtual Visits (Telemedicine).  Patients are able to view lab/test results, encounter notes, upcoming appointments, etc.  Non-urgent messages can be sent to your provider as well.   ?To learn more about what you can do with MyChart, go to [email or web address].   ? ?Your next appointment:   ?1 year(s) ? ?The format for your next appointment:   ?In Person ? ?Provider:   ?Dr Mark Skains ? ?If primary card or EP is not listed click here to update    :1}  ? ? ?Thank you for choosing [facility]!! ? ? ? ?

## 2021-04-07 NOTE — Assessment & Plan Note (Signed)
Doing very well.  Rate controlled.  He is not on any AV nodal blocking agents.  Continues with Eliquis.  Doing well. ?

## 2021-04-07 NOTE — Progress Notes (Signed)
?Cardiology Office Note:   ? ?Date:  04/07/2021  ? ?ID:  William Chung, DOB [birth year], MRN [id number] ? ?PCP:  Gaynelle Arabian, MD  ?Cardiologist:  Candee Furbish, MD  ?Electrophysiologist:  None  ? ?Referring MD: Gaynelle Arabian, MD  ? ? ? ?History of Present Illness:   ? ?William Chung is a 81 y.o. male here for a follow-up for artrial fibrillation.  ? ?Since his last visit, he called on 11/12/2020 reporting excess bleeding after a gum surgery by Dr. Geralynn Ochs without holding Eliquis. Agreed to hold Eliquis for a few days to make sure his bleeding was under control. ? ?He is asymptomatic from a GI standpoint no problems previously. ? ?He has a history of atrial fibrillation hyperlipidemia.  His father died age 39 from pneumonia mother died at 52 from dementia.  He has had his right knee replaced.  Used to smoke quit in 1990 currently on Eliquis 5 mg twice a day and simvastatin 20 mg a day.   ? ?I saw him last in 2015.  At that time he had rate controlled atrial fibrillation.  His EKG reviewed from 02/04/2013 showed A. fib heart rate 81 bpm with poor R wave progression.  Overall at that time he was enjoying golf playing and bald head, has a house at [facility] carries his own bag.  Fairly asymptomatic. ? ?Was seeing Dr. Ivan Anchors in [place].  He has retired. ? ?He did have a Cologuard that was abnormal. ? ?Today,  ?Overall, he is doing well. ? ?He exercised at 7 am this morning and mentioned he is working with a Clinical research associate. He enjoys occasional walks [place] the golf [place]. Currently he is playing golf daily. Since moving to [place] he reports that he has not been as active. [facility] previously. 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 ? ?Normally, he can not tell when he is in a-fib. He is asymptomatic.  ? ?His recent lab work had good findings. His bp reading today at the clinic is 140/78, which he notes is high for him. ? ?He reports that he bruises easily since taking the Eliquis, and he  has not had any recent bleeding since starting. He has never taken the Lisinopril medication. ? ?He denies any palpitations, chest pain, shortness of breath, or peripheral edema. No lightheadedness, headaches, syncope, orthopnea, or PND.  ? ?Past Medical History:  ?Diagnosis Date  ? Atrial fibrillation ([place])   ? Hematuria   ? Hypercholesterolemia   ? Hypothyroid   ? Shingles   ? Squamous cell cancer of hypopharynx (HCC)   ? , vocal cords  ? ? ?Past Surgical History:  ?Procedure Laterality Date  ? INGUINAL HERNIA REPAIR Right   ? LAMINECTOMY    ? POLYPECTOMY    ? , Vocal Cords  ? ? ?Current Medications: ?Current Meds  ?Medication Sig  ? acetaminophen (TYLENOL) 500 MG tablet Take 500 mg by mouth every 6 (six) hours as needed for headache.  ? ELIQUIS 5 MG TABS tablet Take 5 mg by mouth in the morning and at bedtime.  ? levothyroxine (SYNTHROID, LEVOTHROID) 50 MCG tablet Take 50 mcg by mouth daily before breakfast.  ? simvastatin (ZOCOR) 20 MG tablet Take 20 mg by mouth daily.  ?  ? ?Allergies:   Patient has no allergy information on record.  ? ?Social History  ? ?Socioeconomic History  ? Marital status: Married  ?  Spouse name: Not on file  ? Number of children: Not  on file  ? Years of education: Not on file  ? Highest education level: Not on file  ?Occupational History  ? Not on file  ?Tobacco Use  ? Smoking status: Former  ? Smokeless tobacco: Never  ?Vaping Use  ? Vaping Use: Never used  ?Substance and Sexual Activity  ? Alcohol use: Yes  ?  Comment: VERY LITTLE BEER, ONLY DRINKS WINE AND LIQUOR  ? Drug use: No  ? Sexual activity: Not on file  ?Other Topics Concern  ? Not on file  ?Social History Narrative  ? Not on file  ? ?Social Determinants of Health  ? ?Financial Resource Strain: Not on file  ?Food Insecurity: Not on file  ?Transportation Needs: Not on file  ?Physical Activity: Not on file  ?Stress: Not on file  ?Social Connections: Not on file  ?  ? ?Family History: ?The patient's family history includes  Congestive Heart Failure in his mother; Dementia in his mother; Pneumonia in his father. ? ?ROS:   ?Please see the history of present illness.   ?(+) Easy bruising ?All other systems reviewed and are negative. ? ?EKGs/Labs/Other Studies Reviewed:   ? ?The following studies were reviewed today: ?Prior office note reviewed prior echo with normal EF ? ?2D Echo 02/20/2013: ?Notes Recorded by Candee Furbish, MD on 02/20/2013 at 5:20 PM ?Low normal EF. Left atrium is moderately to severely dilated. I would like to give him low-dose lisinopril 5 mg once a day. Monitor for any side effects of dry cough. Left atrial dilatation is reason for atrial fibrillation. ? ? ? ?EKG:  EKG is personally reviewed.  ?04/07/2021: Atrial Fibrillation. Rate 80 bpm.  ?04/16/2019 A. fib 89 PVC ? ?Recent Labs: ?No results found for requested labs within last 8760 hours.  ?Recent Lipid Panel ?No results found for: CHOL, TRIG, HDL, CHOLHDL, VLDL, LDLCALC, LDLDIRECT ? ?Physical Exam:   ? ?VS:  BP 140/78 (BP Location: Left Arm, Patient Position: Sitting, Cuff Size: Normal)   Pulse 80   Ht '5\' 11"'$  (1.803 m)   Wt 175 lb (79.4 kg)   SpO2 93%   BMI 24.41 kg/m?    ? ?Wt Readings from Last 3 Encounters:  ?04/07/21 175 lb (79.4 kg)  ?04/16/19 173 lb 6.4 oz (78.7 kg)  ?04/03/13 183 lb (83 kg)  ?  ? ?GEN:  Well nourished, well developed in no acute distress ?HEENT: Normal ?NECK: No JVD; No carotid bruits ?LYMPHATICS: No lymphadenopathy ?CARDIAC: Irregularly irregular, no murmurs, rubs, gallops ?RESPIRATORY:  Clear to auscultation without rales, wheezing or rhonchi  ?ABDOMEN: Soft, non-tender, non-distended ?MUSCULOSKELETAL:  No edema; No deformity  ?SKIN: Warm and dry, Tan ?NEUROLOGIC:  Alert and oriented x 3 ?PSYCHIATRIC:  Normal affect  ? ?ASSESSMENT:   ? ?1. Permanent atrial fibrillation ([place])   ?2. Hypercoagulable state due to longstanding persistent atrial fibrillation ([place])   ?3. Pure hypercholesterolemia   ?4. Elevated blood pressure reading   ? ? ?PLAN:    ? ?In order of problems listed above: ? ?Permanent atrial fibrillation ([place]) ?Doing very well.  Rate controlled.  He is not on any AV nodal blocking agents.  Continues with Eliquis.  Doing well. ? ?Hypercoagulable state due to atrial fibrillation ([place]) ?Atrial fibrillation.  Eliquis 5 mg twice a day.  Continue ? ?Most recent blood work reassuring.  Creatinine 0.9 hemoglobin A1c 5.9 TSH 3.1 ALT 12. ? ?Pure hypercholesterolemia ?Continue with simvastatin 20 mg a day.  Last LDL 70.  Wonderful.  HDL 82.  No myalgias. ? ?  Elevated blood pressure reading ?Blood pressure little bit elevated today.  He will continue to monitor at home.  He used to be on lisinopril many years ago but this was stopped quite some time ago as well.  Has not needed any blood pressure medication.  Continue with daily exercise walking, enjoys golf. ?  ? ? ? ?Follow-up 1 year ?Medication Adjustments/Labs and Tests Ordered: ?Current medicines are reviewed at length with the patient today.  Concerns regarding medicines are outlined above.  ?Orders Placed This Encounter  ?Procedures  ? EKG 12-Lead  ? ?No orders of the defined types were placed in this encounter. ? ? ?Patient Instructions  ?Medication Instructions:  ?The current medical regimen is effective;  continue present plan and medications. ? ?*If you need a refill on your cardiac medications before your next appointment, please call your pharmacy* ? ?Follow-Up: ?At [facility], you and your health needs are our priority.  As part of our continuing mission to provide you with exceptional heart care, we have created designated Provider Care Teams.  These Care Teams include your primary Cardiologist (physician) and Advanced Practice Providers (APPs -  Physician Assistants and Nurse Practitioners) who all work together to provide you with the care you need, when you need it. ? ?We recommend signing up for the patient portal called "MyChart".  Sign up information is provided on this After Visit  Summary.  MyChart is used to connect with patients for Virtual Visits (Telemedicine).  Patients are able to view lab/test results, encounter notes, upcoming appointments, etc.  Non-urgent messages can be sent to

## 2021-05-29 ENCOUNTER — Emergency Department (HOSPITAL_COMMUNITY): Payer: Medicare (Managed Care)

## 2021-05-29 ENCOUNTER — Emergency Department (HOSPITAL_COMMUNITY)
Admission: EM | Admit: 2021-05-29 | Discharge: 2021-05-29 | Disposition: A | Discharge status: Home / Self Care | Payer: Medicare (Managed Care) | Attending: Emergency Medicine | Admitting: Emergency Medicine

## 2021-05-29 ENCOUNTER — Other Ambulatory Visit: Payer: Self-pay

## 2021-05-29 ENCOUNTER — Encounter (HOSPITAL_COMMUNITY): Payer: Self-pay

## 2021-05-29 DIAGNOSIS — M79672 Pain in left foot: Secondary | ICD-10-CM | POA: Diagnosis present

## 2021-05-29 DIAGNOSIS — Z7901 Long term (current) use of anticoagulants: Secondary | ICD-10-CM | POA: Diagnosis not present

## 2021-05-29 MED ORDER — OXYCODONE-ACETAMINOPHEN 5-325 MG PO TABS: 1.0000 | ORAL_TABLET | ORAL | 0 refills | Status: DC | PRN

## 2021-05-29 NOTE — ED Provider Notes (Signed)
[facility] DEPT Provider Note   CSN: [id number] Arrival date & time: 05/29/21  0115     History  Chief Complaint  Patient presents with   Foot Pain    William Chung is a 81 y.o. male.  Patient presents to the emergency department for evaluation of foot pain.  Patient reports that he has been having intermittent pains in his left foot.  Patient reports that this afternoon he started having intermittent sharp spasms of pain on the top of his foot.  This evening and tonight the pain is gotten worse.  Pain happens intermittently, is severe and has been keeping him up.  He is worried that it might be gout because it is at the base of his big toe.  He has not had gout before.  No injury.      Home Medications Prior to Admission medications   Medication Sig Start Date End Date Taking? Authorizing Provider  oxyCODONE-acetaminophen (PERCOCET) 5-325 MG tablet Take 1-2 tablets by mouth every 4 (four) hours as needed. 05/29/21  Yes Titiana Severa, Gwenyth Allegra, MD  acetaminophen (TYLENOL) 500 MG tablet Take 500 mg by mouth every 6 (six) hours as needed for headache.    [provider]  ELIQUIS 5 MG TABS tablet Take 5 mg by mouth in the morning and at bedtime. 03/11/19   [provider]  levothyroxine (SYNTHROID, LEVOTHROID) 50 MCG tablet Take 50 mcg by mouth daily before breakfast.    [provider]  lisinopril (PRINIVIL,ZESTRIL) 5 MG tablet TAKE 1 TABLET BY MOUTH DAILY Patient not taking: Reported on 04/07/2021    Jerline Pain, MD  simvastatin (ZOCOR) 20 MG tablet Take 20 mg by mouth daily.    [provider]      Allergies    Patient has no allergy information on record.    Review of Systems   Review of Systems  Physical Exam Updated Vital Signs BP (!) 157/98   Pulse 67   Temp 97.7 F (36.5 C) (Oral)   Resp 16   Ht '5\' 11"'$  (1.803 m)   Wt 79.4 kg   SpO2 100%   BMI 24.41 kg/m  Physical Exam Vitals and nursing  note reviewed.  Constitutional:      General: He is not in acute distress.    Appearance: He is well-developed.  HENT:     Head: Normocephalic and atraumatic.     Mouth/Throat:     Mouth: Mucous membranes are moist.  Eyes:     General: Vision grossly intact. Gaze aligned appropriately.     Extraocular Movements: Extraocular movements intact.     Conjunctiva/sclera: Conjunctivae normal.  Cardiovascular:     Rate and Rhythm: Normal rate and regular rhythm.     Pulses: Normal pulses.          Dorsalis pedis pulses are 2+ on the left side.     Heart sounds: Normal heart sounds, S1 normal and S2 normal. No murmur heard.   No friction rub. No gallop.  Pulmonary:     Effort: Pulmonary effort is normal. No respiratory distress.     Breath sounds: Normal breath sounds.  Abdominal:     Palpations: Abdomen is soft.     Tenderness: There is no abdominal tenderness. There is no guarding or rebound.     Hernia: No hernia is present.  Musculoskeletal:        General: No swelling.     Cervical back: Full passive range of motion  without pain, normal range of motion and neck supple. No pain with movement, spinous process tenderness or muscular tenderness. Normal range of motion.     Right lower leg: No edema.     Left lower leg: No edema.  Skin:    General: Skin is warm and dry.     Capillary Refill: Capillary refill takes less than 2 seconds.     Findings: No ecchymosis, erythema, lesion or wound.  Neurological:     Mental Status: He is alert and oriented to person, place, and time.     GCS: GCS eye subscore is 4. GCS verbal subscore is 5. GCS motor subscore is 6.     Cranial Nerves: Cranial nerves 2-12 are intact.     Sensory: Sensation is intact.     Motor: Motor function is intact. No weakness or abnormal muscle tone.     Coordination: Coordination is intact.  Psychiatric:        Mood and Affect: Mood normal.        Speech: Speech normal.        Behavior: Behavior normal.    ED  Results / Procedures / Treatments   Labs (all labs ordered are listed, but only abnormal results are displayed) Labs Reviewed - No data to display  EKG None  Radiology DG Foot Complete Left  Result Date: 05/29/2021 CLINICAL DATA:  Pain near 1st MTP joint.  No known injury. EXAM: LEFT FOOT - COMPLETE 3+ VIEW COMPARISON:  None Available. FINDINGS: There is no evidence of fracture or dislocation. There is no evidence of arthropathy or other focal bone abnormality. Soft tissues are unremarkable. IMPRESSION: Negative. Electronically Signed   By: Rolm Baptise M.D.   On: 05/29/2021 02:10    Procedures Procedures    Medications Ordered in ED Medications - No data to display  ED Course/ Medical Decision Making/ A&P                           Medical Decision Making Amount and/or Complexity of Data Reviewed Radiology: ordered.  Risk Prescription drug management.   Patient presents to the emergency department for evaluation of foot pain.  Differential diagnosis includes ischemic foot, gout, arthritis, infectious etiology, injury.  Examination of the foot does not show any evidence of trauma.  No overlying signs of infection.  Dorsalis pedal pulses palpable, bounding.  No evidence of ischemia.  No upper leg swelling, no concern for DVT.  Pain is sharp, stabbing and lancinating, intermittent.  Sounds neuropathic.  MTP joint is nontender, normal range of motion.  Doubt gout.  Will discharge with analgesia, no further work-up necessary.        Final Clinical Impression(s) / ED Diagnoses Final diagnoses:  Foot pain, left    Rx / DC Orders ED Discharge Orders          Ordered    oxyCODONE-acetaminophen (PERCOCET) 5-325 MG tablet  Every 4 hours PRN        05/29/21 0318              Orpah Greek, MD 05/29/21 [phone]

## 2021-05-29 NOTE — ED Triage Notes (Signed)
Pt states that he has had a sharp pain in the top of his left foot since yesterday.

## 2022-01-04 DIAGNOSIS — L570 Actinic keratosis: Secondary | ICD-10-CM | POA: Diagnosis not present

## 2022-03-14 DIAGNOSIS — R7301 Impaired fasting glucose: Secondary | ICD-10-CM | POA: Diagnosis not present

## 2022-03-14 DIAGNOSIS — Z0000 Encounter for general adult medical examination without abnormal findings: Secondary | ICD-10-CM | POA: Diagnosis not present

## 2022-03-14 DIAGNOSIS — Z79899 Other long term (current) drug therapy: Secondary | ICD-10-CM | POA: Diagnosis not present

## 2022-03-14 DIAGNOSIS — E039 Hypothyroidism, unspecified: Secondary | ICD-10-CM | POA: Diagnosis not present

## 2022-03-14 DIAGNOSIS — E7800 Pure hypercholesterolemia, unspecified: Secondary | ICD-10-CM | POA: Diagnosis not present

## 2022-03-14 DIAGNOSIS — D6869 Other thrombophilia: Secondary | ICD-10-CM | POA: Diagnosis not present

## 2022-03-14 DIAGNOSIS — Z1331 Encounter for screening for depression: Secondary | ICD-10-CM | POA: Diagnosis not present

## 2022-03-14 DIAGNOSIS — Z8521 Personal history of malignant neoplasm of larynx: Secondary | ICD-10-CM | POA: Diagnosis not present

## 2022-03-14 DIAGNOSIS — I4821 Permanent atrial fibrillation: Secondary | ICD-10-CM | POA: Diagnosis not present

## 2022-03-31 DIAGNOSIS — Z08 Encounter for follow-up examination after completed treatment for malignant neoplasm: Secondary | ICD-10-CM | POA: Diagnosis not present

## 2022-03-31 DIAGNOSIS — Z8521 Personal history of malignant neoplasm of larynx: Secondary | ICD-10-CM | POA: Diagnosis not present

## 2022-03-31 DIAGNOSIS — Z923 Personal history of irradiation: Secondary | ICD-10-CM | POA: Diagnosis not present

## 2022-04-05 DIAGNOSIS — K648 Other hemorrhoids: Secondary | ICD-10-CM | POA: Diagnosis not present

## 2022-04-14 ENCOUNTER — Encounter: Payer: Self-pay | Admitting: Cardiology

## 2022-04-14 ENCOUNTER — Ambulatory Visit: Payer: Medicare PPO | Attending: Cardiology | Admitting: Cardiology

## 2022-04-14 VITALS — BP 134/77 | HR 66 | Ht 71.0 in | Wt 165.0 lb

## 2022-04-14 DIAGNOSIS — E7800 Pure hypercholesterolemia, unspecified: Secondary | ICD-10-CM

## 2022-04-14 DIAGNOSIS — I4821 Permanent atrial fibrillation: Secondary | ICD-10-CM

## 2022-04-14 DIAGNOSIS — D6869 Other thrombophilia: Secondary | ICD-10-CM | POA: Diagnosis not present

## 2022-04-14 DIAGNOSIS — I4811 Longstanding persistent atrial fibrillation: Secondary | ICD-10-CM

## 2022-04-14 NOTE — Patient Instructions (Signed)
Medication Instructions:  The current medical regimen is effective;  continue present plan and medications.  *If you need a refill on your cardiac medications before your next appointment, please call your pharmacy*  Follow-Up: At [facility], you and your health needs are our priority.  As part of our continuing mission to provide you with exceptional heart care, we have created designated Provider Care Teams.  These Care Teams include your primary Cardiologist (physician) and Advanced Practice Providers (APPs -  Physician Assistants and Nurse Practitioners) who all work together to provide you with the care you need, when you need it.  We recommend signing up for the patient portal called "MyChart".  Sign up information is provided on this After Visit Summary.  MyChart is used to connect with patients for Virtual Visits (Telemedicine).  Patients are able to view lab/test results, encounter notes, upcoming appointments, etc.  Non-urgent messages can be sent to your provider as well.   To learn more about what you can do with MyChart, go to [email or web address].    Your next appointment:   1 year(s)  Provider:   Mark Skains, MD      

## 2022-04-14 NOTE — Progress Notes (Signed)
Cardiology Office Note:    Date:  04/14/2022   ID:  William Chung, DOB March 07, 1940, MRN [id number]  PCP:  Blair Heys, MD  Cardiologist:  Donato Schultz, MD  Electrophysiologist:  None   Referring MD: Blair Heys, MD     History of Present Illness:    William Chung is a 82 y.o. male here for a follow-up for longstanding persistent fibrillation.   He has a history of atrial fibrillation hyperlipidemia.  His father died age 43 from pneumonia mother died at 36 from dementia.  He has had his right knee replaced.  Used to smoke quit in 1990 currently on Eliquis 5 mg twice a day and simvastatin 20 mg a day.    Previously in 2015,  he had rate controlled atrial fibrillation.  His EKG reviewed from 02/04/2013 showed A. fib heart rate 81 bpm with poor R wave progression.  Overall at that time he was enjoying golf playing and bald head, has a house at [facility] carries his own bag.    He exercised at 7 am this morning and mentioned he is working with a Psychologist, educational. He enjoys walking at the golf course. Currently he is playing golf daily. Since moving to [place] he reports that he has not been as active. [facility] previously. 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  Normally, he can not tell when he is in a-fib. He is asymptomatic.   He reports that he bruises easily since taking the Eliquis, and he has not had any recent bleeding since starting.   Currently enjoying watching the masters  No fevers chills nausea vomiting syncope bleeding.  Past Medical History:  Diagnosis Date   Atrial fibrillation    Hematuria    Hypercholesterolemia    Hypothyroid    Shingles    Squamous cell cancer of hypopharynx    , vocal cords    Past Surgical History:  Procedure Laterality Date   INGUINAL HERNIA REPAIR Right    LAMINECTOMY     POLYPECTOMY     , Vocal Cords    Current Medications: Current Meds  Medication Sig   acetaminophen (TYLENOL) 500 MG tablet Take  500 mg by mouth every 6 (six) hours as needed for headache.   ELIQUIS 5 MG TABS tablet Take 5 mg by mouth in the morning and at bedtime.   levothyroxine (SYNTHROID, LEVOTHROID) 50 MCG tablet Take 50 mcg by mouth daily before breakfast.   simvastatin (ZOCOR) 20 MG tablet Take 20 mg by mouth daily.     Allergies:   Patient has no allergy information on record.   Social History   Socioeconomic History   Marital status: Married    Spouse name: Not on file   Number of children: Not on file   Years of education: Not on file   Highest education level: Not on file  Occupational History   Not on file  Tobacco Use   Smoking status: Former   Smokeless tobacco: Never  Vaping Use   Vaping Use: Never used  Substance and Sexual Activity   Alcohol use: Yes    Comment: VERY LITTLE BEER, ONLY DRINKS WINE AND LIQUOR   Drug use: No   Sexual activity: Not on file  Other Topics Concern   Not on file  Social History Narrative   Not on file   Social Determinants of Health   Financial Resource Strain: Not on file  Food Insecurity: Not on file  Transportation Needs: Not on  file  Physical Activity: Not on file  Stress: Not on file  Social Connections: Not on file     Family History: The patient's family history includes Congestive Heart Failure in his mother; Dementia in his mother; Pneumonia in his father.  ROS:   Please see the history of present illness.   All other systems reviewed and are negative.  EKGs/Labs/Other Studies Reviewed:    The following studies were reviewed today: Prior office note reviewed prior echo with normal EF  2D Echo 02/20/2013: Notes Recorded by Donato Schultz, MD on 02/20/2013 at 5:20 PM Low normal EF. Left atrium is moderately to severely dilated. I would like to give him low-dose lisinopril 5 mg once a day. Monitor for any side effects of dry cough. Left atrial dilatation is reason for atrial fibrillation.        EKG:  EKG is personally reviewed.   04/14/22-atrial fibrillation 66 left axis deviation poor R wave progression. 04/07/2021: Atrial Fibrillation. Rate 80 bpm.  04/16/2019 A. fib 89 PVC  Recent Labs: No results found for requested labs within last 365 days.  Recent Lipid Panel No results found for: "CHOL", "TRIG", "HDL", "CHOLHDL", "VLDL", "LDLCALC", "LDLDIRECT"  Physical Exam:    VS:  BP 134/77 (BP Location: Left Arm, Patient Position: Sitting, Cuff Size: Normal)   Pulse 66   Ht  (1.803 m)   Wt 165 lb (74.8 kg)   BMI 23.01 kg/m     Wt Readings from Last 3 Encounters:  04/14/22 165 lb (74.8 kg)  05/29/21 175 lb (79.4 kg)  04/07/21 175 lb (79.4 kg)     GEN: Well nourished, well developed, in no acute distress HEENT: normal Neck: no JVD, carotid bruits, or masses Cardiac: Irreg normal rate; no murmurs, rubs, or gallops,no edema  Respiratory:  clear to auscultation bilaterally, normal work of breathing GI: soft, nontender, nondistended, + BS MS: no deformity or atrophy Skin: warm and dry, no rash Neuro:  Alert and Oriented x 3, Strength and sensation are intact Psych: euthymic mood, full affect   ASSESSMENT:    1. Permanent atrial fibrillation   2. Hypercoagulable state due to longstanding persistent atrial fibrillation   3. Pure hypercholesterolemia      PLAN:    In order of problems listed above:    Permanent atrial fibrillation (HCC) Doing very well.  Rate controlled.  He is not on any AV nodal blocking agents.  Continues with Eliquis.  Doing well.  No changes made.  Asymptomatic    Hypercoagulable state due to atrial fibrillation (HCC) Atrial fibrillation.  Eliquis 5 mg twice a day.  Continue.  Easy bruising on forearms.  Continue to monitor  Most recent blood work reassuring.  Creatinine 0.9 hemoglobin A1c 5.9 TSH 3.1 ALT 12.  Pure hypercholesterolemia Continue with simvastatin 20 mg a day. Wonderful.  LDL 61.  No myalgias.   Follow-up 1 year  Medication Adjustments/Labs and Tests  Ordered: Current medicines are reviewed at length with the patient today.  Concerns regarding medicines are outlined above.  Orders Placed This Encounter  Procedures   EKG 12-Lead   No orders of the defined types were placed in this encounter.   Patient Instructions  Medication Instructions:  The current medical regimen is effective;  continue present plan and medications.  *If you need a refill on your cardiac medications before your next appointment, please call your pharmacy*   Follow-Up: At [facility], you and your health needs are our priority.  As  part of our continuing mission to provide you with exceptional heart care, we have created designated Provider Care Teams.  These Care Teams include your primary Cardiologist (physician) and Advanced Practice Providers (APPs -  Physician Assistants and Nurse Practitioners) who all work together to provide you with the care you need, when you need it.  We recommend signing up for the patient portal called "MyChart".  Sign up information is provided on this After Visit Summary.  MyChart is used to connect with patients for Virtual Visits (Telemedicine).  Patients are able to view lab/test results, encounter notes, upcoming appointments, etc.  Non-urgent messages can be sent to your provider as well.   To learn more about what you can do with MyChart, go to [email or web address].    Your next appointment:   1 year(s)  Provider:   Donato Schultz, MD         [facility],acting as a scribe for Donato Schultz, MD.,have documented all relevant documentation on the behalf of Donato Schultz, MD,as directed by  Donato Schultz, MD while in the presence of Donato Schultz, MD.   I, Donato Schultz, MD, have reviewed all documentation for this visit. The documentation on 04/14/22 for the exam, diagnosis, procedures, and orders are all accurate and complete.   Signed, Donato Schultz, MD  04/14/2022 9:47 AM    [facility]

## 2022-05-01 DIAGNOSIS — H353131 Nonexudative age-related macular degeneration, bilateral, early dry stage: Secondary | ICD-10-CM | POA: Diagnosis not present

## 2022-08-07 DIAGNOSIS — D1801 Hemangioma of skin and subcutaneous tissue: Secondary | ICD-10-CM | POA: Diagnosis not present

## 2022-08-07 DIAGNOSIS — L812 Freckles: Secondary | ICD-10-CM | POA: Diagnosis not present

## 2022-08-07 DIAGNOSIS — D0421 Carcinoma in situ of skin of right ear and external auricular canal: Secondary | ICD-10-CM | POA: Diagnosis not present

## 2022-08-07 DIAGNOSIS — L821 Other seborrheic keratosis: Secondary | ICD-10-CM | POA: Diagnosis not present

## 2022-08-07 DIAGNOSIS — D485 Neoplasm of uncertain behavior of skin: Secondary | ICD-10-CM | POA: Diagnosis not present

## 2022-08-07 DIAGNOSIS — L720 Epidermal cyst: Secondary | ICD-10-CM | POA: Diagnosis not present

## 2022-09-18 ENCOUNTER — Encounter: Payer: Self-pay | Admitting: Pulmonary Disease

## 2022-09-18 ENCOUNTER — Ambulatory Visit: Payer: Medicare PPO | Admitting: Pulmonary Disease

## 2022-09-18 ENCOUNTER — Ambulatory Visit (INDEPENDENT_AMBULATORY_CARE_PROVIDER_SITE_OTHER): Payer: Medicare PPO

## 2022-09-18 VITALS — BP 122/78 | HR 72 | Temp 97.3°F | Ht 71.0 in | Wt 166.4 lb

## 2022-09-18 DIAGNOSIS — R062 Wheezing: Secondary | ICD-10-CM

## 2022-09-18 DIAGNOSIS — R0602 Shortness of breath: Secondary | ICD-10-CM

## 2022-09-18 DIAGNOSIS — I517 Cardiomegaly: Secondary | ICD-10-CM | POA: Diagnosis not present

## 2022-09-18 NOTE — Progress Notes (Addendum)
William Chung    [id number]    October 24, 1940  Primary Care Physician:Ehinger, Molly Maduro, MD  Referring Physician: Blair Heys, MD [address] [place],  [place] [address]  Chief complaint: Cough, wheezing  HPI: 82 y.o. who  has a past medical history of Atrial fibrillation (HCC), Hematuria, Hypercholesterolemia, Hypothyroid, Shingles, Sleep apnea, Squamous cell cancer of hypopharynx (HCC), and Throat cancer (HCC).   Discussed the use of AI scribe software for clinical note transcription with the patient, who gave verbal consent to proceed.   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 The symptoms began after an RSV infection in spring 2023 and have progressively worsened. The patient describes the sound as involuntary and denies associated pain. He also reports occasional 'catches' in the neck that resolve quickly. The patient's spouse reports that the patient has been more tired recently and has lost weight, though the patient attributes this to age and reports the weight loss was intentional. He denies shortness of breath and continues to play golf daily. The patient's spouse also reports a change in his voice, describing it as hoarser and with more congestion. He attributes this to radiation treatment for throat cancer. The patient also reports poor sleep, which he attributes to age. The patient has a history of smoking, but quit over 30 years ago.      Pets: Dog Occupation: Retired Veterinary surgeon Exposures: No mold, hot tub, Jacuzzi.  No feather pillows or comforters Smoking history: Quit smoking around 1985.  Prior to that he used to smoke socially Travel history: No significant recent travel Relevant family history: No family history of lung disease  Outpatient Encounter Medications as of  09/18/2022  Medication Sig   acetaminophen (TYLENOL) 500 MG tablet Take 500 mg by mouth every 6 (six) hours as needed for headache.   ELIQUIS 5 MG TABS tablet Take 5 mg by mouth in the morning and at bedtime.   levothyroxine (SYNTHROID, LEVOTHROID) 50 MCG tablet Take 50 mcg by mouth daily before breakfast.   simvastatin (ZOCOR) 20 MG tablet Take 20 mg by mouth daily.   No facility-administered encounter medications on file as of 09/18/2022.    Allergies as of 09/18/2022   (No Known Allergies)    Past Medical History:  Diagnosis Date   Atrial fibrillation (HCC)    Hematuria    Hypercholesterolemia    Hypothyroid    Shingles    Sleep apnea    Squamous cell cancer of hypopharynx (HCC)    , vocal cords   Throat cancer (HCC)     Past Surgical History:  Procedure Laterality Date   INGUINAL HERNIA REPAIR Right    LAMINECTOMY     POLYPECTOMY     , Vocal Cords    Family History  Problem Relation Age of Onset   Congestive Heart Failure Mother    Dementia Mother    Pneumonia Father     Social History   Socioeconomic History   Marital status: Married    Spouse name: Not on file   Number of children: Not on file   Years of education: Not on file   Highest education level: Not on file  Occupational History   Not on file  Tobacco Use   Smoking status: Former   Smokeless tobacco: Never  Vaping Use   Vaping  status: Never Used  Substance and Sexual Activity   Alcohol use: Yes    Comment: VERY LITTLE BEER, ONLY DRINKS WINE AND LIQUOR   Drug use: No   Sexual activity: Not on file  Other Topics Concern   Not on file  Social History Narrative   Not on file   Social Determinants of Health   Financial Resource Strain: Not on file  Food Insecurity: Low Risk  (03/31/2022)   [facility]   Hunger Vital Sign    Worried About Running Out of Food in the Last Year: Never true    Ran Out of Food in the Last Year: Never true  Transportation Needs:  No Transportation Needs (03/31/2022)   [facility]   Transportation    In the past 12 months, has lack of reliable transportation kept you from medical appointments, meetings, work or from getting things needed for daily living? : No  Physical Activity: Not on file  Stress: Not on file  Social Connections: Not on file  Intimate Partner Violence: Not on file    Review of systems: Review of Systems  Constitutional: Negative for fever and chills.  HENT: Negative.   Eyes: Negative for blurred vision.  Respiratory: as per HPI  Cardiovascular: Negative for chest pain and palpitations.  Gastrointestinal: Negative for vomiting, diarrhea, blood per rectum. Genitourinary: Negative for dysuria, urgency, frequency and hematuria.  Musculoskeletal: Negative for myalgias, back pain and joint pain.  Skin: Negative for itching and rash.  Neurological: Negative for dizziness, tremors, focal weakness, seizures and loss of consciousness.  Endo/Heme/Allergies: Negative for environmental allergies.  Psychiatric/Behavioral: Negative for depression, suicidal ideas and hallucinations.  All other systems reviewed and are negative.  Physical Exam: Blood pressure 122/78, pulse 72, temperature (!) 97.3 F (36.3 C), temperature source Temporal, height 5\' 11"  (1.803 m), weight 166 lb 6.4 oz (75.5 kg), SpO2 93%. Gen:      No acute distress HEENT:  EOMI, sclera anicteric Neck:     No masses; no thyromegaly Lungs:    Clear to auscultation bilaterally; normal respiratory effort CV:         Regular rate and rhythm; no murmurs Abd:      + bowel sounds; soft, non-tender; no palpable masses, no distension Ext:    No edema; adequate peripheral perfusion Skin:      Warm and dry; no rash Neuro: alert and oriented x 3 Psych: normal mood and affect  Data Reviewed: Imaging: CT chest 06/02/2005-noncalcified pulmonary nodule, clear lungs I have reviewed the images  personally.  PFTs:  Labs:  Assessment and Plan Respiratory Symptoms Involuntary wheezing sound from chest, increased mucus production, and frequent throat clearing. No shortness of breath or pain. History of throat cancer with radiation treatment. Recent ENT evaluation was normal. Possible post-nasal drip or bronchial inflammation. -Order chest X-ray and labs today. -Start over-the-counter antihistamines and steroid nasal spray for potential post-nasal drip. -Schedule lung function test in 3 months.  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 Currently managed with Levothyroxine. -Continue current management.  Weight Loss Voluntary weight loss reported. No concerns of unintentional weight loss. -Continue current lifestyle modifications.  Follow-up in 3 months to reassess respiratory symptoms and review results of lung function test.   Recommendations: Flonase, over-the-counter Zyrtec, Claritin CBC with differential, IgE Chest x-ray PFTs in 3 months  Chilton Greathouse MD [facility] 09/18/2022, 10:04 AM  CC: Blair Heys, MD

## 2022-09-18 NOTE — Patient Instructions (Signed)
Will start you on a medication called Flonase You can use over-the-counter antiallergy medication such as Zyrtec or Claritin 1-2 times a day to reduce the postnasal drip and throat congestion Will check some labs including CBC with differential, IgE Check chest x-ray today Schedule PFTs in 3 months and return to clinic in 3 months

## 2022-09-19 DIAGNOSIS — R062 Wheezing: Secondary | ICD-10-CM | POA: Diagnosis not present

## 2022-09-19 DIAGNOSIS — R0602 Shortness of breath: Secondary | ICD-10-CM | POA: Diagnosis not present

## 2022-10-04 ENCOUNTER — Institutional Professional Consult (permissible substitution): Payer: Medicare PPO | Admitting: Pulmonary Disease

## 2022-10-11 ENCOUNTER — Encounter: Payer: Self-pay | Admitting: Pulmonary Disease

## 2022-12-13 ENCOUNTER — Telehealth: Payer: Self-pay | Admitting: Pulmonary Disease

## 2022-12-13 ENCOUNTER — Ambulatory Visit: Payer: Medicare PPO | Admitting: Pulmonary Disease

## 2022-12-13 ENCOUNTER — Encounter: Payer: Self-pay | Admitting: Pulmonary Disease

## 2022-12-13 VITALS — BP 122/64 | HR 79 | Temp 98.0°F | Ht 71.0 in | Wt 167.0 lb

## 2022-12-13 DIAGNOSIS — R0602 Shortness of breath: Secondary | ICD-10-CM | POA: Diagnosis not present

## 2022-12-13 DIAGNOSIS — D649 Anemia, unspecified: Secondary | ICD-10-CM

## 2022-12-13 DIAGNOSIS — R062 Wheezing: Secondary | ICD-10-CM

## 2022-12-13 DIAGNOSIS — J309 Allergic rhinitis, unspecified: Secondary | ICD-10-CM

## 2022-12-13 LAB — PULMONARY FUNCTION TEST
DL/VA % pred: 91 %
DL/VA: 3.5 ml/min/mmHg/L
DLCO cor % pred: 67 %
DLCO cor: 16.54 ml/min/mmHg
DLCO unc % pred: 67 %
DLCO unc: 16.54 ml/min/mmHg
FEF 25-75 Post: 1.56 L/s
FEF 25-75 Pre: 1.13 L/s
FEF2575-%Change-Post: 38 %
FEF2575-%Pred-Post: 83 %
FEF2575-%Pred-Pre: 60 %
FEV1-%Change-Post: 7 %
FEV1-%Pred-Post: 73 %
FEV1-%Pred-Pre: 68 %
FEV1-Post: 2.07 L
FEV1-Pre: 1.93 L
FEV1FVC-%Change-Post: 6 %
FEV1FVC-%Pred-Pre: 98 %
FEV6-%Change-Post: 1 %
FEV6-%Pred-Post: 74 %
FEV6-%Pred-Pre: 72 %
FEV6-Post: 2.77 L
FEV6-Pre: 2.72 L
FEV6FVC-%Change-Post: 1 %
FEV6FVC-%Pred-Post: 107 %
FEV6FVC-%Pred-Pre: 105 %
FVC-%Change-Post: 0 %
FVC-%Pred-Post: 69 %
FVC-%Pred-Pre: 68 %
FVC-Post: 2.79 L
FVC-Pre: 2.76 L
Post FEV1/FVC ratio: 74 %
Post FEV6/FVC ratio: 100 %
Pre FEV1/FVC ratio: 70 %
Pre FEV6/FVC Ratio: 98 %
RV % pred: 109 %
RV: 3.07 L
TLC % pred: 84 %
TLC: 6.13 L

## 2022-12-13 NOTE — Patient Instructions (Signed)
Full PFT performed today. °

## 2022-12-13 NOTE — Patient Instructions (Signed)
VISIT SUMMARY:  During today's visit, we discussed your persistent respiratory symptoms, including the crackling sound in your chest and occasional cough, as well as your morning runny nose and mild anemia. We reviewed your recent lung function test and chest X-ray results.  YOUR PLAN:  -RESPIRATORY SYMPTOMS: You have been experiencing a persistent crackling sound in your chest and occasional cough. A lung function test showed a mild reduction in your lung's ability to bring oxygen into your blood, although your chest X-ray was normal. We will order a CT scan of your chest to further investigate these symptoms and the abnormal lung function test results.  -ALLERGIC RHINITIS: Allergic rhinitis is an allergic reaction that causes sneezing, runny nose, and other similar symptoms. Your elevated IgE levels indicate allergies, and your symptoms have improved with Zyrtec. Please continue taking Zyrtec as needed to manage your allergy symptoms.  -MILD ANEMIA: Mild anemia means that your hemoglobin levels are slightly below the normal range, which can sometimes cause fatigue or weakness. Your hemoglobin level is 12.7, which is just below the normal range of 13-17. We will monitor your hemoglobin levels, but no treatment is needed at this time.  INSTRUCTIONS:  Please schedule a CT scan of your chest as soon as possible to further investigate your respiratory symptoms. Continue taking Zyrtec as needed for your allergies. We will monitor your hemoglobin levels during your next visit.

## 2022-12-13 NOTE — Telephone Encounter (Signed)
Patient would like a prescription for Zertex so that it is cheaper than over the counter.

## 2022-12-13 NOTE — Progress Notes (Signed)
Full PFT performed today. °

## 2022-12-13 NOTE — Progress Notes (Signed)
William Chung    [id number]    13-Dec-1940  Primary Care Physician:Ehinger, Molly Maduro, MD (Inactive)  Referring Physician: Blair Heys, MD [address] [place],  [place] [address]  Chief complaint: Cough, wheezing  HPI: 82 y.o. who  has a past medical history of Atrial fibrillation (HCC), Hematuria, Hypercholesterolemia, Hypothyroid, Shingles, Sleep apnea, Squamous cell cancer of hypopharynx (HCC), and Throat cancer (HCC).   Discussed the use of AI scribe software for clinical note transcription with the patient, who gave verbal consent to proceed.   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 The symptoms began after an RSV infection in spring 2023 and have progressively worsened. The patient describes the sound as involuntary and denies associated pain. He also reports occasional 'catches' in the neck that resolve quickly. The patient's spouse reports that the patient has been more tired recently and has lost weight, though the patient attributes this to age and reports the weight loss was intentional. He denies shortness of breath and continues to play golf daily. The patient's spouse also reports a change in his voice, describing it as hoarser and with more congestion. He attributes this to radiation treatment for throat cancer. The patient also reports poor sleep, which he attributes to age. The patient has a history of smoking, but quit over 30 years ago.      Pets: Dog Occupation: Retired Veterinary surgeon Exposures: No mold, hot tub, Jacuzzi.  No feather pillows or comforters Smoking history: Quit smoking around 1985.  Prior to that he used to smoke socially Travel history: No significant recent travel Relevant family history: No family history of lung disease  Interim history: Discussed the  use of AI scribe software for clinical note transcription with the patient, who gave verbal consent to proceed.  Reports no change in symptoms.  He was given Flonase and antihistamine at last visit but has not made any difference.  He feels that he is doing well but his wife is concerned about his health and wants him checked out.  Outpatient Encounter Medications as of 12/13/2022  Medication Sig   acetaminophen (TYLENOL) 500 MG tablet Take 500 mg by mouth every 6 (six) hours as needed for headache.   ELIQUIS 5 MG TABS tablet Take 5 mg by mouth in the morning and at bedtime.   levothyroxine (SYNTHROID, LEVOTHROID) 50 MCG tablet Take 50 mcg by mouth daily before breakfast.   simvastatin (ZOCOR) 20 MG tablet Take 20 mg by mouth daily.   No facility-administered encounter medications on file as of 12/13/2022.    Physical Exam: Blood pressure 122/64, pulse 79, temperature 98 F (36.7 C), temperature source Oral, height 5\' 11"  (1.803 m), weight 167 lb (75.8 kg), SpO2 96%. Gen:      No acute distress HEENT:  EOMI, sclera anicteric Neck:     No masses; no thyromegaly Lungs:    Clear to auscultation bilaterally; normal respiratory effort CV:         Regular rate and rhythm; no murmurs Abd:      + bowel sounds; soft, non-tender; no palpable masses, no distension Ext:    No edema; adequate peripheral perfusion Skin:      Warm and dry; no rash Neuro: alert and oriented x 3 Psych: normal mood and affect   Data Reviewed: Imaging: CT chest 06/02/2005-noncalcified  pulmonary nodule, clear lungs Chest x-ray 09/18/2022-no active cardiopulmonary disease I have reviewed the images personally.  PFTs: 12/13/2022 FVC 2.79 [6 9%], FEV1 2.07 [93%], F/F74, TLC 6.13 [84%], DLCO 16.54 [16%] Mild diffusion defect  Labs:  Assessment and Plan Respiratory Symptoms Involuntary wheezing sound from chest, increased mucus production, and frequent throat clearing. No shortness of breath or pain. History of throat  cancer with radiation treatment. Recent ENT evaluation was normal. Possible post-nasal drip or bronchial inflammation.  Chest x-ray looks normal.  Labs unremarkable except for mild reduction in hemoglobin and elevated IgE.  PFTs reviewed with mild diffusion impairment  -Order CT scan of the chest to further investigate the cause of the abnormal lung function test and persistent respiratory symptoms as his wife is concerned for his health..  Allergic Rhinitis Elevated IgE indicating allergies.  -Continue Zyrtec as needed for allergy symptoms.  Mild Anemia Hemoglobin slightly low at 12.7 (normal range 13-17). -Monitor hemoglobin levels. No intervention needed at this time.   Recommendations: CT chest  Chilton Greathouse MD [facility] 12/13/2022, 4:24 PM  CC: Blair Heys, MD

## 2022-12-14 ENCOUNTER — Other Ambulatory Visit (HOSPITAL_COMMUNITY): Payer: Self-pay

## 2022-12-14 NOTE — Telephone Encounter (Signed)
OTC medications not covered by patient's plan

## 2022-12-19 ENCOUNTER — Ambulatory Visit: Payer: Medicare PPO | Admitting: Internal Medicine

## 2022-12-19 NOTE — Telephone Encounter (Signed)
LVM per [employer] does not cover OTC medications. Also stated patient could try generic for cetrizine.

## 2022-12-29 ENCOUNTER — Ambulatory Visit
Admission: RE | Admit: 2022-12-29 | Discharge: 2022-12-29 | Disposition: A | Discharge status: Home / Self Care | Payer: Medicare PPO | Source: Ambulatory Visit | Attending: Pulmonary Disease | Admitting: Pulmonary Disease

## 2022-12-29 DIAGNOSIS — R0602 Shortness of breath: Secondary | ICD-10-CM

## 2022-12-29 DIAGNOSIS — I2510 Atherosclerotic heart disease of native coronary artery without angina pectoris: Secondary | ICD-10-CM | POA: Diagnosis not present

## 2022-12-29 DIAGNOSIS — I700 Atherosclerosis of aorta: Secondary | ICD-10-CM | POA: Diagnosis not present

## 2023-02-16 ENCOUNTER — Ambulatory Visit: Payer: Medicare HMO | Admitting: Pulmonary Disease

## 2023-02-16 ENCOUNTER — Encounter: Payer: Self-pay | Admitting: Pulmonary Disease

## 2023-02-16 VITALS — BP 124/76 | HR 69 | Ht 71.0 in | Wt 170.0 lb

## 2023-02-16 DIAGNOSIS — I4891 Unspecified atrial fibrillation: Secondary | ICD-10-CM

## 2023-02-16 DIAGNOSIS — R0602 Shortness of breath: Secondary | ICD-10-CM

## 2023-02-16 NOTE — Patient Instructions (Signed)
I am glad you are doing well His CT shows very minimal changes at the lungs that are not concerning for any interstitial lung disease Please continue to monitor your symptoms and follow-up as needed

## 2023-02-16 NOTE — Progress Notes (Signed)
 William Chung    [id number]    1940/05/13  Primary Care Physician:Ehinger, Molly Maduro, MD (Inactive)  Referring Physician: No referring provider defined for this encounter.  Chief complaint: Cough, wheezing  HPI: 83 y.o. who  has a past medical history of Atrial fibrillation (HCC), Hematuria, Hypercholesterolemia, Hypothyroid, Shingles, Sleep apnea, Squamous cell cancer of hypopharynx (HCC), and Throat cancer (HCC).   Discussed the use of AI scribe software for clinical note transcription with the patient, who gave verbal consent to proceed.   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 The symptoms began after an RSV infection in spring 2023 and have progressively worsened. The patient describes the sound as involuntary and denies associated pain. He also reports occasional 'catches' in the neck that resolve quickly. The patient's spouse reports that the patient has been more tired recently and has lost weight, though the patient attributes this to age and reports the weight loss was intentional. He denies shortness of breath and continues to play golf daily. The patient's spouse also reports a change in his voice, describing it as hoarser and with more congestion. He attributes this to radiation treatment for throat cancer. The patient also reports poor sleep, which he attributes to age. The patient has a history of smoking, but quit over 30 years ago.      Pets: Dog Occupation: Retired Veterinary surgeon Exposures: No mold, hot tub, Jacuzzi.  No feather pillows or comforters Smoking history: Quit smoking around 1985.  Prior to that he used to smoke socially Travel history: No significant recent travel Relevant family history: No family history of lung disease  Interim history: Discussed the use of AI scribe software  for clinical note transcription with the patient, who gave verbal consent to proceed.  William Chung is an 83 year old male with atrial fibrillation and coronary artery disease who presents for a follow-up review of a chest CT scan. He is accompanied by [name], his wife.  He underwent a chest CT scan, which revealed mild changes, including a small area of ground-glass opacity at the base, possibly due to prior inflammation or infection. No lung nodules or other significant issues were noted.  He feels good overall, with no significant symptoms of wheezing or shortness of breath. However, his wife notes occasional throat clearing, which worries her. He describes his condition as 'just a hoarseness' and is not using any inhalers.  He has a history of atrial fibrillation and coronary artery disease. He is currently on Eliquis and a statin but not on aspirin. He has not had a stent placed. He mentions having an appointment with his cardiologist next month, which occurs annually. He acknowledges some plaque buildup in his heart's blood vessels but is asymptomatic, with no chest pain reported.    Outpatient Encounter Medications as of 02/16/2023  Medication Sig   acetaminophen (TYLENOL) 500 MG tablet Take 500 mg by mouth every 6 (six) hours as needed for headache.   ELIQUIS 5 MG TABS tablet Take 5 mg by mouth in the morning and at bedtime.   levothyroxine (SYNTHROID, LEVOTHROID) 50 MCG tablet Take 50 mcg by mouth daily before breakfast.   simvastatin (ZOCOR) 20 MG tablet Take 20 mg by mouth daily.   No facility-administered encounter medications on file as of 02/16/2023.    Physical Exam: Blood pressure 124/76,  pulse 69, height 5\' 11"  (1.803 m), weight 170 lb (77.1 kg), SpO2 98%. William:      No acute distress HEENT:  EOMI, sclera anicteric Neck:     No masses; no thyromegaly Lungs:    Clear to auscultation bilaterally; normal respiratory effort CV:         Regular rate and rhythm; no  murmurs Abd:      + bowel sounds; soft, non-tender; no palpable masses, no distension Ext:    No edema; adequate peripheral perfusion Skin:      Warm and dry; no rash Neuro: alert and oriented x 3 Psych: normal mood and affect   Data Reviewed: Imaging: CT chest 06/02/2005-noncalcified pulmonary nodule, clear lungs Chest x-ray 09/18/2022-no active cardiopulmonary disease High resolution CT 12/29/2022- Minimal groundglass attenuation, septal thickening at the extreme lung base mild apical pleural-parenchymal thickening, aortic, coronary atherosclerosis, mild cardiomegaly.  I reviewed the images personally.  PFTs: 12/13/2022 FVC 2.79 [6 9%], FEV1 2.07 [93%], F/F74, TLC 6.13 [84%], DLCO 16.54 [16%] Mild diffusion defect  Labs:  Assessment and Plan Wheezing and Shortness of Breath   Intermittent wheezing and shortness of breath reported by his wife, though he states he is not bothersome. CT chest shows minimal ground-glass opacities at the base, likely due to prior infection or inflammation. No significant concern at this time.   - Monitor symptoms   - Follow up as needed    Atrial Fibrillation and Coronary Artery Disease   Atrial fibrillation and coronary artery disease, asymptomatic and managed with Eliquis and a statin. CT chest shows coronary plaque buildup. Cardiologist follow-up scheduled next month. Discussed continued management if asymptomatic and emphasized heart attack prevention.   - Continue Eliquis   - Continue statin   - Follow up with cardiologist next month   - Cardiologist to review CT scan    General Health Maintenance   Discussed the importance of avoiding fast food for overall health.   - Encourage healthy diet and lifestyle    Follow-up   - Call if symptoms worsen.   Recommendations: Follow up as needed  Chilton Greathouse MD [facility] 02/16/2023, 9:14 AM  CC: No ref. provider found

## 2023-03-15 DIAGNOSIS — R7303 Prediabetes: Secondary | ICD-10-CM | POA: Diagnosis not present

## 2023-03-15 DIAGNOSIS — Z1331 Encounter for screening for depression: Secondary | ICD-10-CM | POA: Diagnosis not present

## 2023-03-15 DIAGNOSIS — E039 Hypothyroidism, unspecified: Secondary | ICD-10-CM | POA: Diagnosis not present

## 2023-03-15 DIAGNOSIS — I4821 Permanent atrial fibrillation: Secondary | ICD-10-CM | POA: Diagnosis not present

## 2023-03-15 DIAGNOSIS — M1990 Unspecified osteoarthritis, unspecified site: Secondary | ICD-10-CM | POA: Diagnosis not present

## 2023-03-15 DIAGNOSIS — D6869 Other thrombophilia: Secondary | ICD-10-CM | POA: Diagnosis not present

## 2023-03-15 DIAGNOSIS — Z0000 Encounter for general adult medical examination without abnormal findings: Secondary | ICD-10-CM | POA: Diagnosis not present

## 2023-03-15 DIAGNOSIS — E782 Mixed hyperlipidemia: Secondary | ICD-10-CM | POA: Diagnosis not present

## 2023-04-18 ENCOUNTER — Ambulatory Visit: Payer: Medicare (Managed Care) | Attending: Cardiology | Admitting: Cardiology

## 2023-04-18 ENCOUNTER — Encounter: Payer: Self-pay | Admitting: Cardiology

## 2023-04-18 VITALS — BP 142/84 | HR 67 | Ht 71.0 in | Wt 168.2 lb

## 2023-04-18 DIAGNOSIS — D6869 Other thrombophilia: Secondary | ICD-10-CM

## 2023-04-18 DIAGNOSIS — I4821 Permanent atrial fibrillation: Secondary | ICD-10-CM

## 2023-04-18 DIAGNOSIS — E7800 Pure hypercholesterolemia, unspecified: Secondary | ICD-10-CM

## 2023-04-18 MED ORDER — SIMVASTATIN 20 MG PO TABS: 20.0000 mg | ORAL_TABLET | Freq: Every day | ORAL | 3 refills | Status: AC

## 2023-04-18 NOTE — Patient Instructions (Signed)

## 2023-04-18 NOTE — Progress Notes (Signed)
 Cardiology Office Note:  .   Date:  04/18/2023  ID:  William Chung, DOB 12-29-1940, MRN [id number] PCP: Jannelle Memory, MD (Inactive)  [facility] Providers Cardiologist:  Dorothye Gathers, MD     History of Present Illness: .   William Chung is a 83 y.o. male Discussed the use of AI scribe software for clinical note transcription with the patient, who gave verbal consent to proceed.  History of Present Illness William Chung is an 83 year old male with persistent atrial fibrillation and hyperlipidemia here for follow-up.  He has a history of long-standing persistent atrial fibrillation, which is well rate-controlled with Eliquis 5 mg twice a day. No sensation of the atrial fibrillation, chest pain, or shortness of breath. An EKG today confirms the presence of atrial fibrillation.  He also has hyperlipidemia, managed with simvastatin 20 mg daily. Recent lab results show an LDL of 63 mg/dL and triglycerides of 60 mg/dL.  He has hypothyroidism, for which he takes levothyroxine 50 mcg daily. His recent TSH level is 4.1.  He underwent an echocardiogram in 2015, which showed a low normal ejection fraction and a severely dilated left atrium.  He feels generally well and active, playing golf regularly, but his wife notes he experiences fatigue, particularly after physical activity and in the evenings. No significant shortness of breath or chest pain during activities.  He has a history of coronary artery disease, as noted by a pulmonologist following a CT scan, but he has not had any stents or angioplasty. He is currently on a prevention strategy with simvastatin.  He has a past medical history of cancer, which has not recurred.    Studies Reviewed: William Chung   EKG Interpretation Date/Time:  Wednesday April 18 2023 09:56:41 EDT Ventricular Rate:  67 PR Interval:    QRS Duration:  78 QT Interval:  428 QTC Calculation: 452 R Axis:   -7  Text Interpretation: Atrial  fibrillation When compared with ECG of 26-Sep-2003 18:13, Premature atrial complexes are no longer Present Confirmed by Dorothye Gathers ([id number]) on 04/18/2023 9:58:03 AM    Results LABS LDL: 63 mg/dL Triglyceride: 60 mg/dL [id number]: 8.2% Hb: 95.6 g/dL K: 4.7 mmol/L ALT: 15 U/L TSH: 4.1 mIU/L  RADIOLOGY Chest CT: Atherosclerosis, calcification of coronary arteries  DIAGNOSTIC Echocardiogram (2015): Low normal ejection fraction, severely dilated left atrium EKG (04/18/2023): Atrial fibrillation Risk Assessment/Calculations:           Physical Exam:   VS:  BP (!) 142/84   Pulse 67   Ht 5\' 11"  (1.803 m)   Wt 168 lb 3.2 oz (76.3 kg)   SpO2 98%   BMI 23.46 kg/m    Wt Readings from Last 3 Encounters:  04/18/23 168 lb 3.2 oz (76.3 kg)  02/16/23 170 lb (77.1 kg)  12/13/22 167 lb (75.8 kg)    GEN: Well nourished, well developed in no acute distress NECK: No JVD; No carotid bruits CARDIAC: Irreg, no murmurs, no rubs, no gallops RESPIRATORY:  Clear to auscultation without rales, wheezing or rhonchi  ABDOMEN: Soft, non-tender, non-distended EXTREMITIES:  No edema; No deformity   ASSESSMENT AND PLAN: .    Assessment and Plan Assessment & Plan Permanent atrial fibrillation Long-standing persistent atrial fibrillation, well rate-controlled on Eliquis. Asymptomatic with no chest pain or dyspnea. EKG confirms adequate rate control, indicating effective management. - Continue Eliquis 5 mg orally twice daily.  Coronary artery disease Coronary artery disease with calcified plaque on CT scan. No history of stents  or angioplasty. Asymptomatic with no angina or significant dyspnea. Simvastatin is used to stabilize plaque and prevent myocardial infarction. Current management is appropriate without invasive procedures unless symptoms develop. - Continue Simvastatin 20 mg orally daily. - Monitor for new symptoms such as angina or significant dyspnea. - Consider echocardiogram to assess cardiac  function given prior low normal EF.  Hyperlipidemia Hyperlipidemia managed with Simvastatin. Recent LDL levels are at goal. - Continue Simvastatin 20 mg orally daily.  Fatigue Reported fatigue, possibly multifactorial. EKG shows good heart rate control. Normal hemoglobin and thyroid function. Fatigue may be related to age, activity level, or non-cardiac factors. He remains active, playing golf regularly, indicating good physical fitness for his age. - Encourage regular physical activity and healthy lifestyle. - Discuss fatigue with primary care physician for further evaluation if needed.  Hypothyroidism Hypothyroidism well-managed with Levothyroxine. Recent TSH levels are within normal range. - Continue Levothyroxine 50 mcg orally daily before breakfast.          Signed, Dorothye Gathers, MD

## 2023-05-07 DIAGNOSIS — H524 Presbyopia: Secondary | ICD-10-CM | POA: Diagnosis not present

## 2023-05-07 DIAGNOSIS — D3131 Benign neoplasm of right choroid: Secondary | ICD-10-CM | POA: Diagnosis not present

## 2023-05-07 DIAGNOSIS — Z961 Presence of intraocular lens: Secondary | ICD-10-CM | POA: Diagnosis not present

## 2024-03-15 IMAGING — DX DG FOOT COMPLETE 3+V*L*
3 series · 3 of 3 positions shown · non-contrast
Comparison: None Available.

CLINICAL DATA: Pain near 1st MTP joint.  No known injury.

EXAM:
LEFT FOOT - COMPLETE 3+ VIEW

[foot ap]
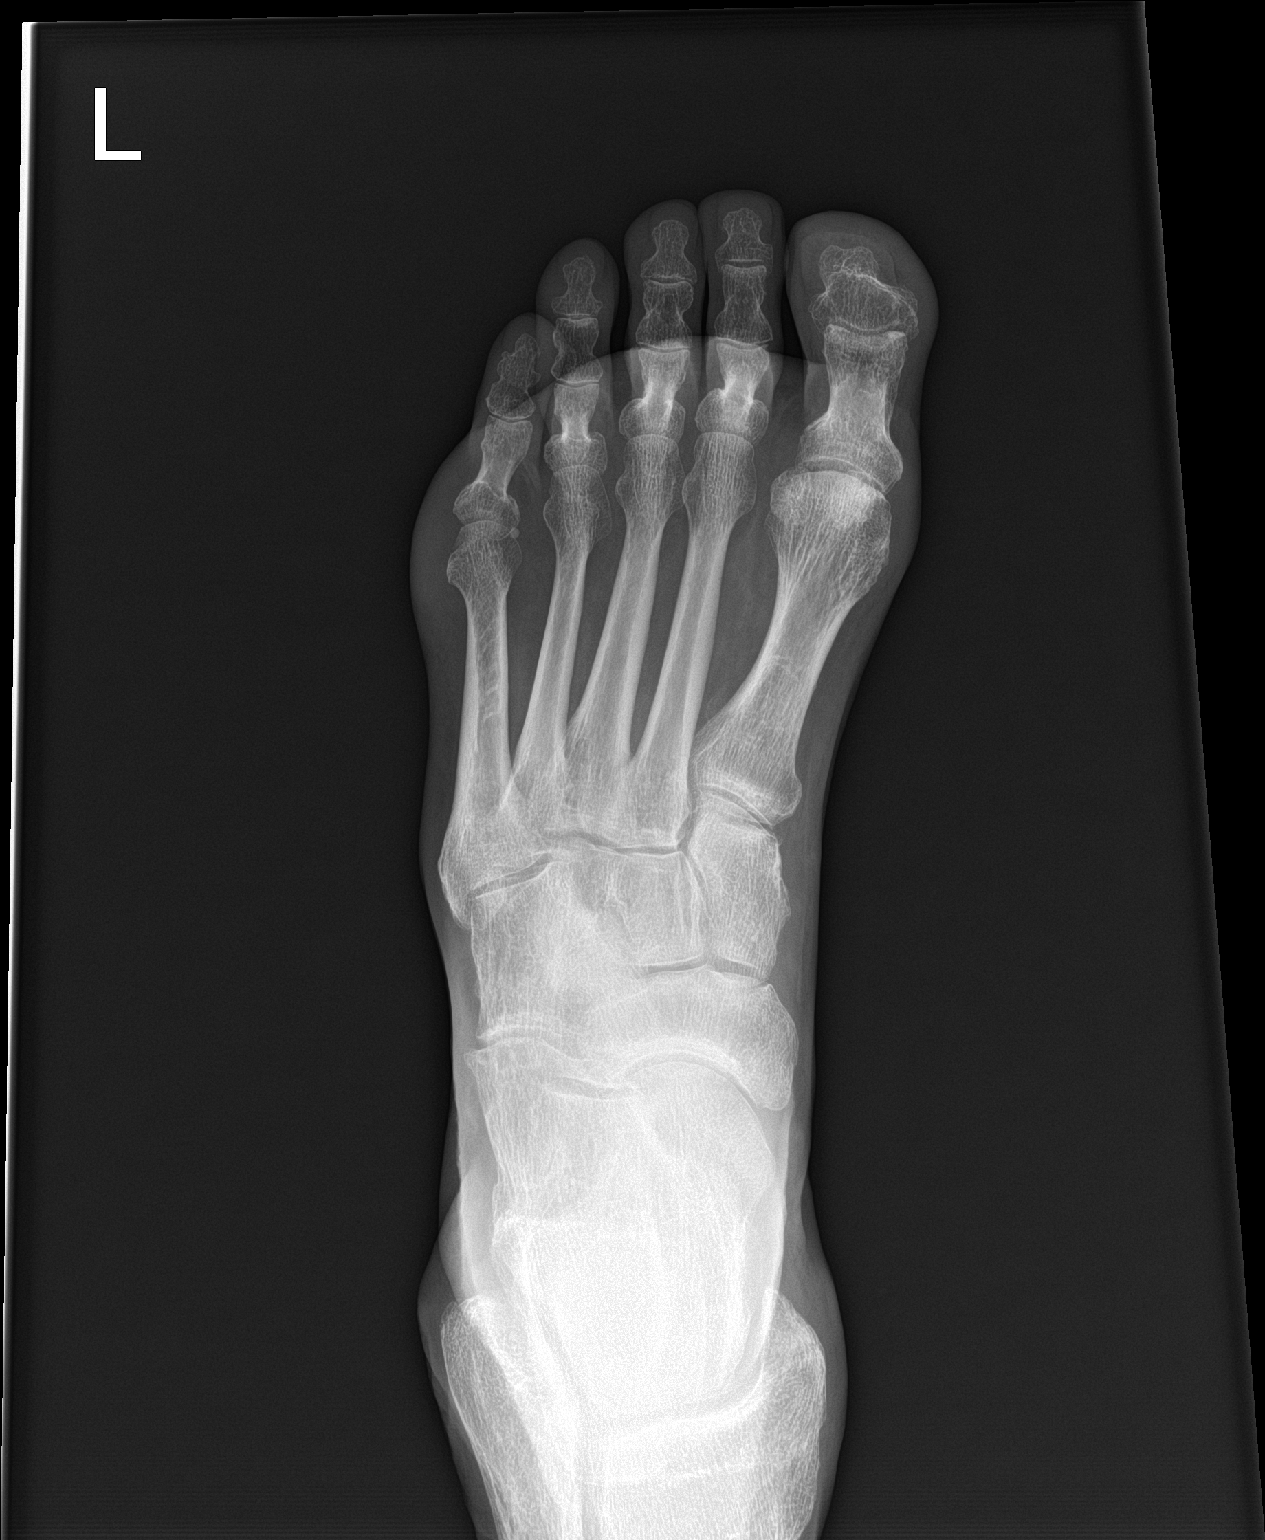

[foot obl]
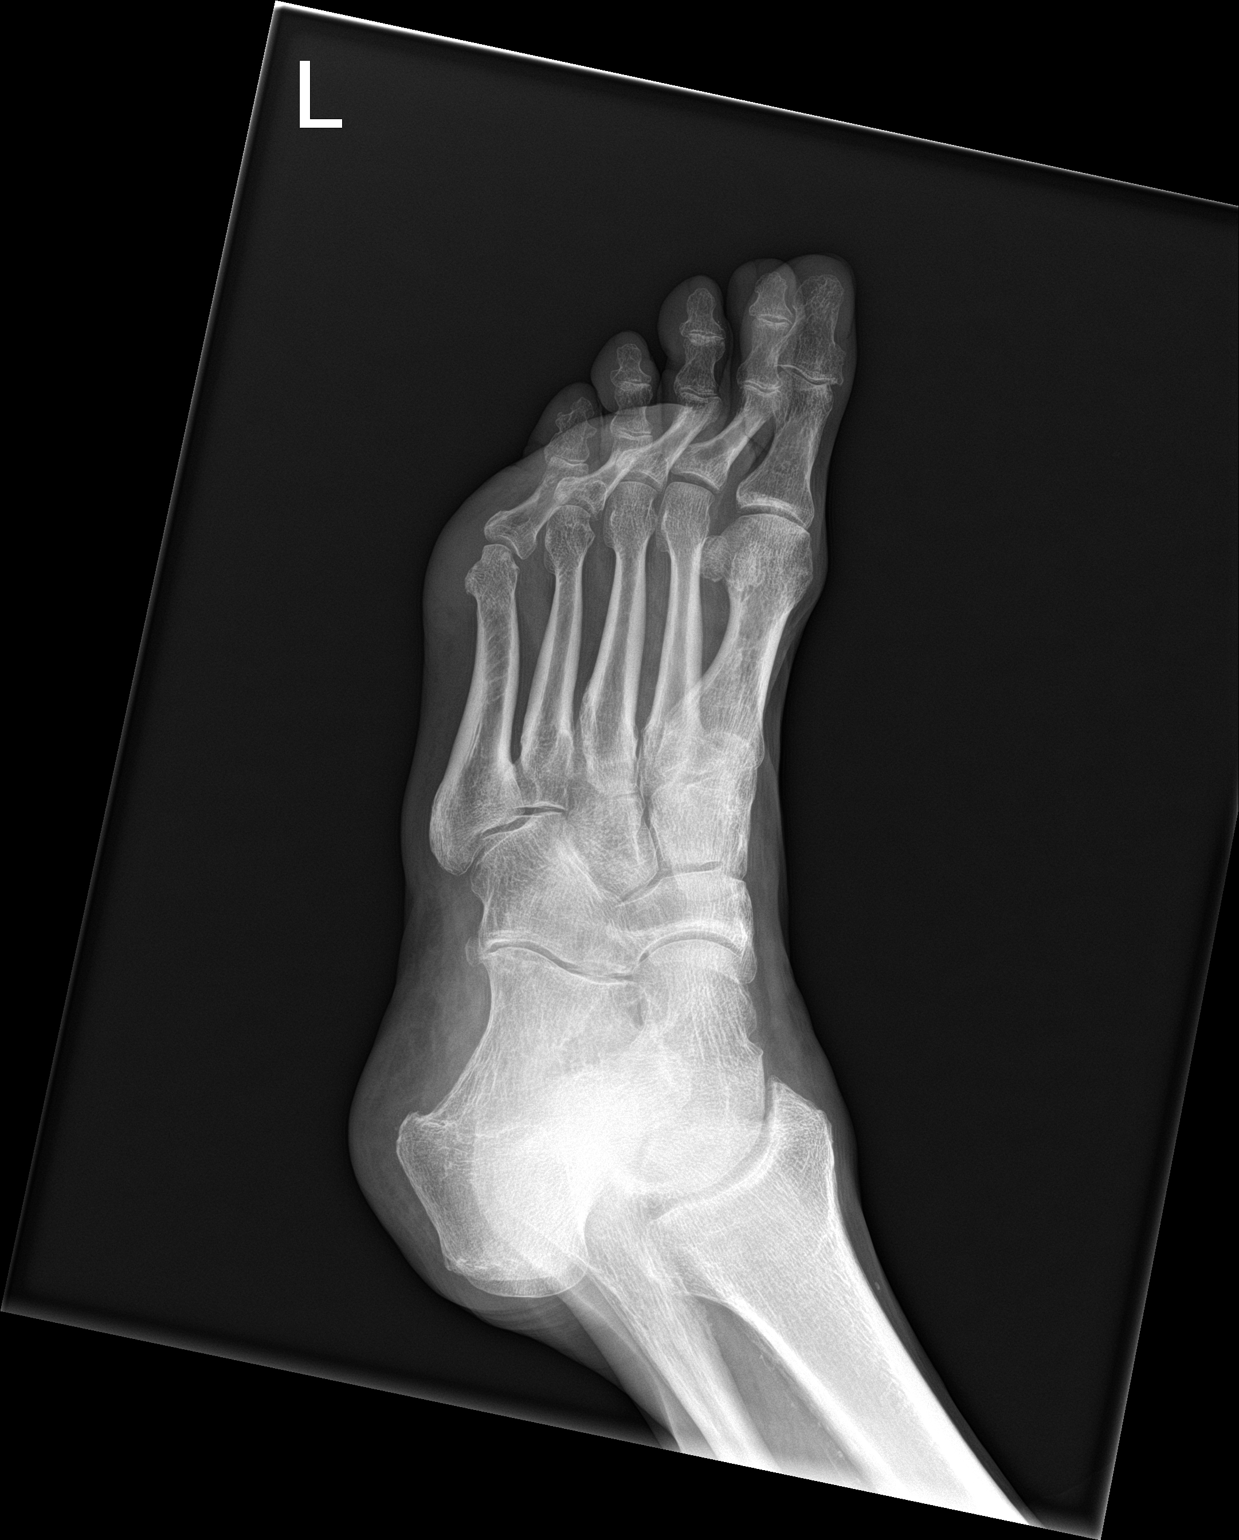

[foot lat]
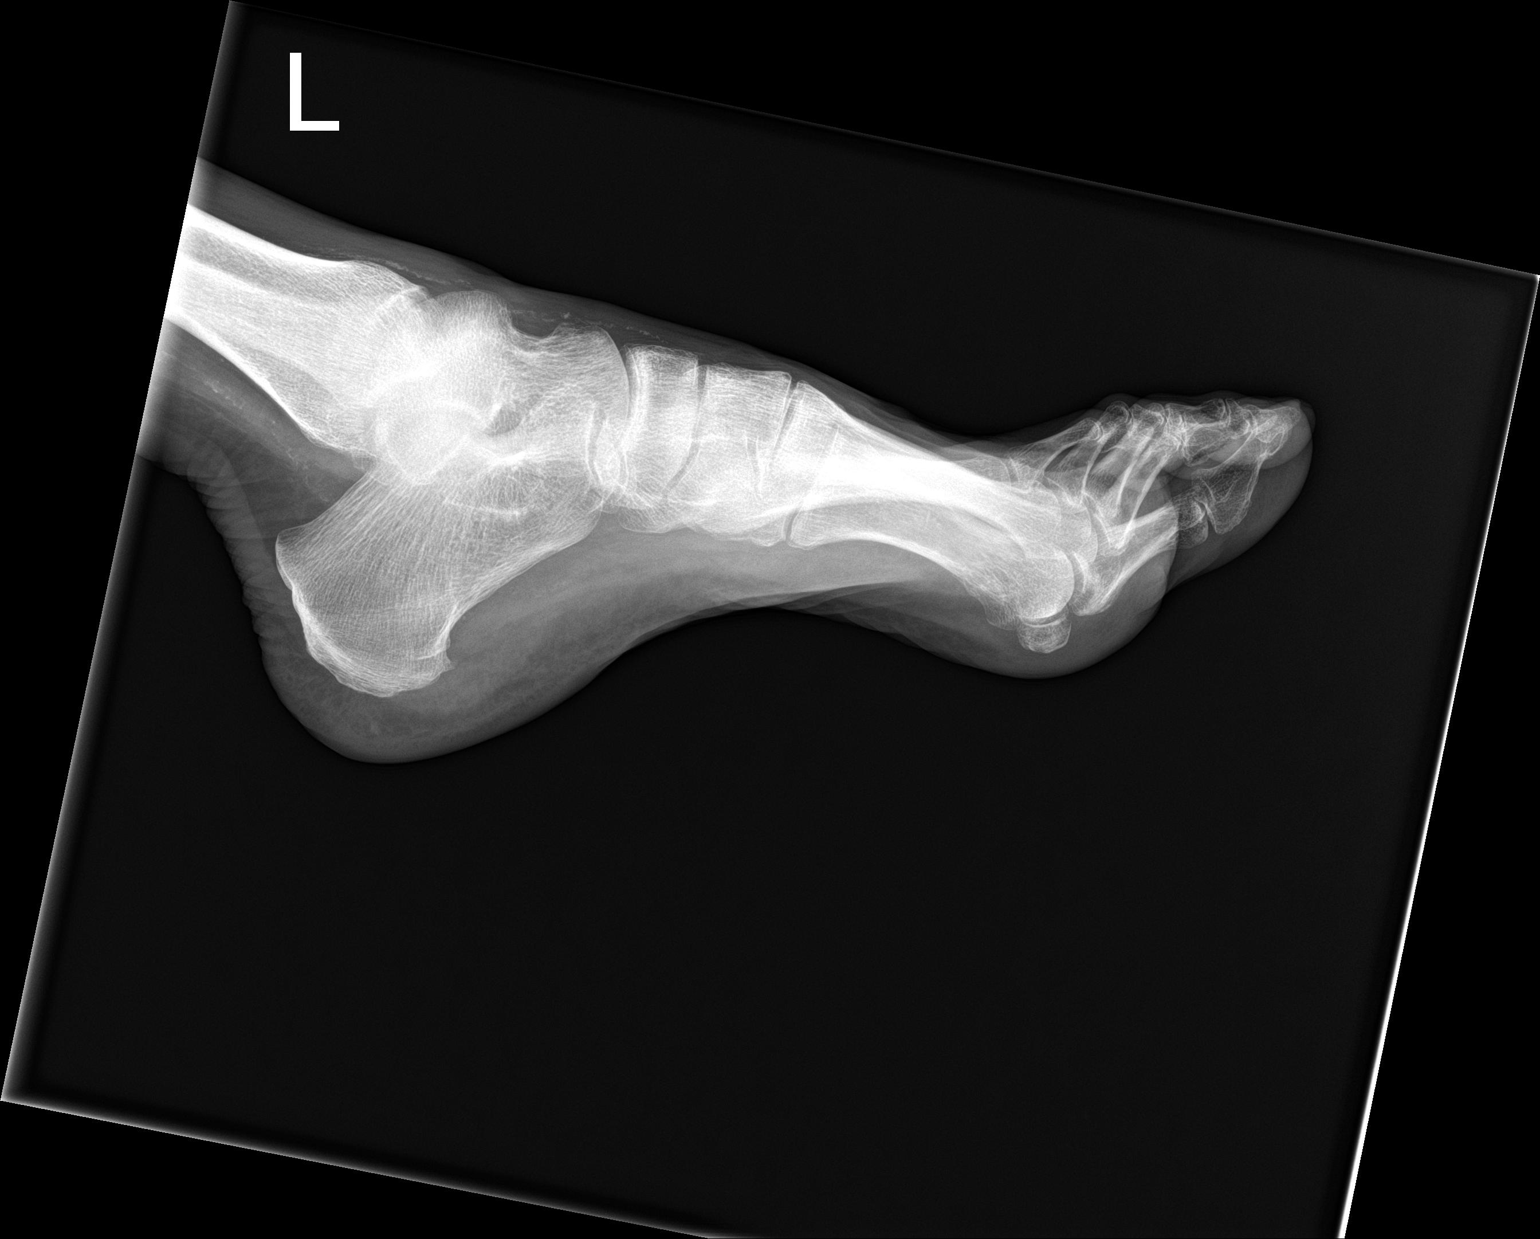

[3 of 3 positions shown; findings below may reference images not displayed]

FINDINGS: There is no evidence of fracture or dislocation. There is no
evidence of arthropathy or other focal bone abnormality. Soft
tissues are unremarkable.
IMPRESSION: Negative.
